# Patient Record
Sex: Male | Born: 1970
Health system: Southern US, Community
[De-identification: ages and names within clinical notes are randomized; demographics above are authoritative.]

## PROBLEM LIST (undated history)

## (undated) DIAGNOSIS — K219 Gastro-esophageal reflux disease without esophagitis: Secondary | ICD-10-CM

## (undated) HISTORY — PX: HERNIA REPAIR: SHX51

---

## 2005-04-17 ENCOUNTER — Emergency Department: Payer: Self-pay | Admitting: Emergency Medicine

## 2006-09-25 ENCOUNTER — Emergency Department: Payer: Self-pay | Admitting: Emergency Medicine

## 2006-10-04 ENCOUNTER — Ambulatory Visit: Payer: Self-pay | Admitting: Otolaryngology

## 2010-03-07 ENCOUNTER — Emergency Department: Payer: Self-pay | Admitting: Emergency Medicine

## 2012-02-19 ENCOUNTER — Emergency Department: Payer: Self-pay | Admitting: Emergency Medicine

## 2012-09-16 ENCOUNTER — Emergency Department: Payer: Self-pay | Admitting: Emergency Medicine

## 2015-12-12 DIAGNOSIS — H5203 Hypermetropia, bilateral: Secondary | ICD-10-CM | POA: Diagnosis not present

## 2016-08-27 ENCOUNTER — Emergency Department
Admission: EM | Admit: 2016-08-27 | Discharge: 2016-08-27 | Disposition: A | Discharge status: Home / Self Care | Payer: Medicare Other | Attending: Emergency Medicine | Admitting: Emergency Medicine

## 2016-08-27 ENCOUNTER — Encounter: Payer: Self-pay | Admitting: Emergency Medicine

## 2016-08-27 DIAGNOSIS — K219 Gastro-esophageal reflux disease without esophagitis: Secondary | ICD-10-CM

## 2016-08-27 DIAGNOSIS — R066 Hiccough: Secondary | ICD-10-CM | POA: Diagnosis present

## 2016-08-27 HISTORY — DX: Gastro-esophageal reflux disease without esophagitis: K21.9

## 2016-08-27 MED ORDER — RANITIDINE HCL 300 MG PO CAPS: 300.0000 mg | ORAL_CAPSULE | Freq: Every evening | ORAL | 0 refills | Status: AC

## 2016-08-27 NOTE — ED Triage Notes (Signed)
Pt in via POV, pt states "I have indigestion and I'm just tired of dealing with it."  Pt reports "burping and hiccups are neverending."  Pt states, "I take medication for it everyday but it doesn't work."  Pt denies any chest pain, denies any N/V/D.  NAD noted at this time.

## 2016-08-27 NOTE — ED Provider Notes (Signed)
[facility] Emergency Department Provider Note  ____________________________________________  Time seen: Approximately 3:52 PM  I have reviewed the triage vital signs and the nursing notes.   HISTORY  Chief Complaint Gastroesophageal Reflux    HPI Albert George is a 46 y.o. male who presents emergency department complaining of GERD symptoms as well as hiccuping. Patient reports that he has had gastroesophageal reflux disease since his early 73s. Patient states that he has been seen by his primary care in the past and diagnosed with this condition. Patient is reporting that he supposed to take omeprazole for this condition. He reports that when he remembers to take the medication or he has the medication, symptoms are controlled. Recently, patient has not been taking his medication and he has had an increase in belching and hiccups. Patient denies any emesis. He denies any diarrhea or constipation. He denies any hematemesis or hematochezia. He denies any abdominal pain. Patient presents emergency department because "I'm tired of dealing with this. I had this for over 20 years and I wanted to stop." Patient has been seen by gastroenterology but states that he doesn't know when the last time he was seen by GI or who the provider was. Patient states that he is currently not taking his omeprazole.   Past Medical History:  Diagnosis Date  . GERD (gastroesophageal reflux disease)     There are no active problems to display for this patient.   Past Surgical History:  Procedure Laterality Date  . HERNIA REPAIR      Prior to Admission medications   Medication Sig Start Date End Date Taking? Authorizing Provider  ranitidine (ZANTAC) 300 MG capsule Take 1 capsule (300 mg total) by mouth every evening. 08/27/16   Delorise Royals Cuthriell, PA-C    Allergies Patient has no known allergies.  No family history on file.  Social History Social History  Substance Use  Topics  . Smoking status: Never Smoker  . Smokeless tobacco: Never Used  . Alcohol use No     Comment: occasional      Review of Systems  Constitutional: No fever/chills ENT: No upper respiratory complaints. Cardiovascular: no chest pain. Respiratory: no cough. No SOB. Gastrointestinal: No abdominal pain. Positive for belching and hiccups. No nausea, no vomiting.  No diarrhea.  No constipation. Musculoskeletal: Negative for musculoskeletal pain. Skin: Negative for rash, abrasions, lacerations, ecchymosis. Neurological: Negative for headaches, focal weakness or numbness. 10-point ROS otherwise negative.  ____________________________________________   PHYSICAL EXAM:  VITAL SIGNS: ED Triage Vitals  Enc Vitals Group     BP 08/27/16 1447 (!) 136/98     Pulse Rate 08/27/16 1447 67     Resp 08/27/16 1447 16     Temp 08/27/16 1447 98 F (36.7 C)     Temp Source 08/27/16 1447 Oral     SpO2 08/27/16 1447 99 %     Weight 08/27/16 1448 180 lb (81.6 kg)     Height 08/27/16 1448 5\' 9"  (1.753 m)     Head Circumference --      Peak Flow --      Pain Score --      Pain Loc --      Pain Edu? --      Excl. in GC? --      Constitutional: Alert and oriented. Well appearing and in no acute distress. Eyes: Conjunctivae are normal. PERRL. EOMI. Head: Atraumatic. ENT:      Ears:       Nose:  No congestion/rhinnorhea.      Mouth/Throat: Mucous membranes are moist. Oropharynx is nonerythematous and nonedematous. Uvula is midline. No dental erosions consistent with repetitive emesis. Neck: No stridor.   Hematological/Lymphatic/Immunilogical: No cervical lymphadenopathy. Cardiovascular: Normal rate, regular rhythm. Normal S1 and S2.  Good peripheral circulation. Respiratory: Normal respiratory effort without tachypnea or retractions. Lungs CTAB. Good air entry to the bases with no decreased or absent breath sounds. Gastrointestinal: Bowel sounds 4 quadrants. Soft and nontender to  palpation. No guarding or rigidity. No palpable masses. No distention. No CVA tenderness Musculoskeletal: Full range of motion to all extremities. No gross deformities appreciated. Neurologic:  Normal speech and language. No gross focal neurologic deficits are appreciated.  Skin:  Skin is warm, dry and intact. No rash noted. Psychiatric: Mood and affect are normal. Speech and behavior are normal. Patient exhibits appropriate insight and judgement.   ____________________________________________   LABS (all labs ordered are listed, but only abnormal results are displayed)  Labs Reviewed - No data to display ____________________________________________  EKG   ____________________________________________  RADIOLOGY   No results found.  ____________________________________________    PROCEDURES  Procedure(s) performed:    Procedures    Medications - No data to display   ____________________________________________   INITIAL IMPRESSION / ASSESSMENT AND PLAN / ED COURSE  Pertinent labs & imaging results that were available during my care of the patient were reviewed by me and considered in my medical decision making (see chart for details).  Review of the [place] CSRS was performed in accordance of the [facility] prior to dispensing any controlled drugs.     Patient's diagnosis is consistent with GERD. Patient has had symptoms for 25 years and reports being "tired of having the symptoms". She has been prescribed omeprazole in the past but is currently not taking medications. Patient reports that while taking medication, symptoms are minimized. I advised patient to continue this medication as well as prescribing ranitidine. Patient is given information to follow up with gastroenterology. no indication for labs or imaging at this time. Patient is given ED precautions to return to the ED for any worsening or new symptoms.     ____________________________________________  FINAL  CLINICAL IMPRESSION(S) / ED DIAGNOSES  Final diagnoses:  Gastroesophageal reflux disease, esophagitis presence not specified      NEW MEDICATIONS STARTED DURING THIS VISIT:  New Prescriptions   RANITIDINE (ZANTAC) 300 MG CAPSULE    Take 1 capsule (300 mg total) by mouth every evening.        This chart was dictated using voice recognition software/Dragon. Despite best efforts to proofread, errors can occur which can change the meaning. Any change was purely unintentional.    Racheal PatchesJonathan D Cuthriell, PA-C 08/27/16 1608    Jennye MoccasinBrian S Quigley, MD 08/27/16 [phone]

## 2016-10-17 DIAGNOSIS — M5416 Radiculopathy, lumbar region: Secondary | ICD-10-CM | POA: Diagnosis not present

## 2016-10-17 DIAGNOSIS — M531 Cervicobrachial syndrome: Secondary | ICD-10-CM | POA: Diagnosis not present

## 2016-10-17 DIAGNOSIS — M9903 Segmental and somatic dysfunction of lumbar region: Secondary | ICD-10-CM | POA: Diagnosis not present

## 2016-10-17 DIAGNOSIS — M9901 Segmental and somatic dysfunction of cervical region: Secondary | ICD-10-CM | POA: Diagnosis not present

## 2017-11-22 DIAGNOSIS — I10 Essential (primary) hypertension: Secondary | ICD-10-CM | POA: Diagnosis not present

## 2017-11-22 DIAGNOSIS — Z125 Encounter for screening for malignant neoplasm of prostate: Secondary | ICD-10-CM | POA: Diagnosis not present

## 2017-11-22 DIAGNOSIS — R066 Hiccough: Secondary | ICD-10-CM | POA: Diagnosis not present

## 2017-11-22 DIAGNOSIS — Z7689 Persons encountering health services in other specified circumstances: Secondary | ICD-10-CM | POA: Diagnosis not present

## 2017-11-22 DIAGNOSIS — K219 Gastro-esophageal reflux disease without esophagitis: Secondary | ICD-10-CM | POA: Diagnosis not present

## 2017-11-24 ENCOUNTER — Other Ambulatory Visit: Payer: Self-pay | Admitting: Internal Medicine

## 2017-11-24 DIAGNOSIS — R7989 Other specified abnormal findings of blood chemistry: Secondary | ICD-10-CM

## 2017-11-24 DIAGNOSIS — R748 Abnormal levels of other serum enzymes: Secondary | ICD-10-CM

## 2017-11-24 DIAGNOSIS — R945 Abnormal results of liver function studies: Secondary | ICD-10-CM

## 2017-12-11 ENCOUNTER — Other Ambulatory Visit: Payer: Self-pay | Admitting: Gastroenterology

## 2017-12-11 DIAGNOSIS — R7989 Other specified abnormal findings of blood chemistry: Secondary | ICD-10-CM

## 2017-12-11 DIAGNOSIS — R945 Abnormal results of liver function studies: Secondary | ICD-10-CM | POA: Diagnosis not present

## 2017-12-11 DIAGNOSIS — R066 Hiccough: Secondary | ICD-10-CM | POA: Diagnosis not present

## 2017-12-11 DIAGNOSIS — K219 Gastro-esophageal reflux disease without esophagitis: Secondary | ICD-10-CM | POA: Diagnosis not present

## 2017-12-14 ENCOUNTER — Ambulatory Visit
Admission: RE | Admit: 2017-12-14 | Discharge: 2017-12-14 | Disposition: A | Discharge status: Home / Self Care | Payer: Medicare Other | Source: Ambulatory Visit | Attending: Gastroenterology | Admitting: Gastroenterology

## 2017-12-14 DIAGNOSIS — K8689 Other specified diseases of pancreas: Secondary | ICD-10-CM | POA: Diagnosis not present

## 2017-12-14 DIAGNOSIS — R7989 Other specified abnormal findings of blood chemistry: Secondary | ICD-10-CM

## 2017-12-14 DIAGNOSIS — K219 Gastro-esophageal reflux disease without esophagitis: Secondary | ICD-10-CM | POA: Insufficient documentation

## 2017-12-14 DIAGNOSIS — R945 Abnormal results of liver function studies: Secondary | ICD-10-CM | POA: Insufficient documentation

## 2018-01-08 DIAGNOSIS — K219 Gastro-esophageal reflux disease without esophagitis: Secondary | ICD-10-CM | POA: Diagnosis not present

## 2018-02-07 ENCOUNTER — Other Ambulatory Visit: Payer: Self-pay | Admitting: Internal Medicine

## 2018-02-07 DIAGNOSIS — R7989 Other specified abnormal findings of blood chemistry: Secondary | ICD-10-CM

## 2018-02-07 DIAGNOSIS — R748 Abnormal levels of other serum enzymes: Secondary | ICD-10-CM

## 2018-02-07 DIAGNOSIS — R945 Abnormal results of liver function studies: Secondary | ICD-10-CM

## 2018-02-13 ENCOUNTER — Ambulatory Visit
Admission: RE | Admit: 2018-02-13 | Discharge: 2018-02-13 | Disposition: A | Discharge status: Home / Self Care | Payer: Medicare Other | Source: Ambulatory Visit | Attending: Internal Medicine | Admitting: Internal Medicine

## 2018-02-13 DIAGNOSIS — R748 Abnormal levels of other serum enzymes: Secondary | ICD-10-CM | POA: Insufficient documentation

## 2018-02-13 DIAGNOSIS — R7989 Other specified abnormal findings of blood chemistry: Secondary | ICD-10-CM | POA: Diagnosis not present

## 2018-02-13 DIAGNOSIS — R945 Abnormal results of liver function studies: Secondary | ICD-10-CM | POA: Diagnosis not present

## 2018-04-11 DIAGNOSIS — K760 Fatty (change of) liver, not elsewhere classified: Secondary | ICD-10-CM | POA: Insufficient documentation

## 2018-04-11 DIAGNOSIS — M545 Low back pain: Secondary | ICD-10-CM | POA: Diagnosis not present

## 2018-04-11 DIAGNOSIS — I10 Essential (primary) hypertension: Secondary | ICD-10-CM | POA: Diagnosis not present

## 2018-04-11 DIAGNOSIS — G8929 Other chronic pain: Secondary | ICD-10-CM | POA: Diagnosis not present

## 2018-06-07 DIAGNOSIS — I10 Essential (primary) hypertension: Secondary | ICD-10-CM | POA: Diagnosis not present

## 2018-06-07 DIAGNOSIS — M545 Low back pain: Secondary | ICD-10-CM | POA: Diagnosis not present

## 2018-06-07 DIAGNOSIS — K760 Fatty (change of) liver, not elsewhere classified: Secondary | ICD-10-CM | POA: Diagnosis not present

## 2018-06-07 DIAGNOSIS — G8929 Other chronic pain: Secondary | ICD-10-CM | POA: Diagnosis not present

## 2018-06-07 DIAGNOSIS — Z23 Encounter for immunization: Secondary | ICD-10-CM | POA: Diagnosis not present

## 2018-08-22 IMAGING — US US ABDOMEN COMPLETE
1 series · 13 of 25 positions shown · non-contrast
Comparison: CT abdomen and pelvis of 04/17/2005

CLINICAL DATA: Elevated liver function tests, history of
gastroesophageal reflux

EXAM:
ABDOMEN ULTRASOUND COMPLETE

[Series 1: us abdomen complete · 0.22mm/px · 13 of 108 slices shown]
[im 1/108]
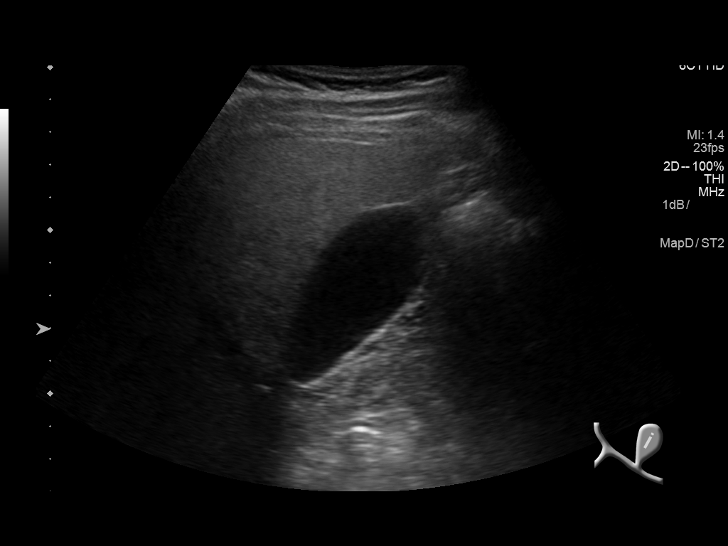
[im 9/108]
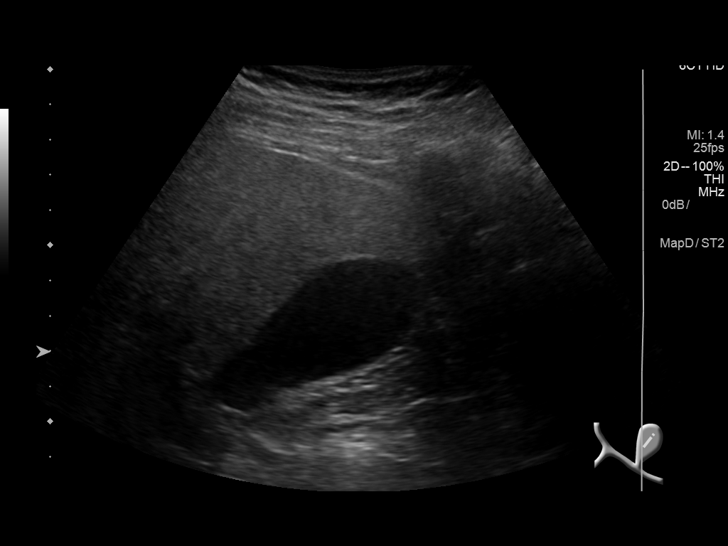
[im 18/108]
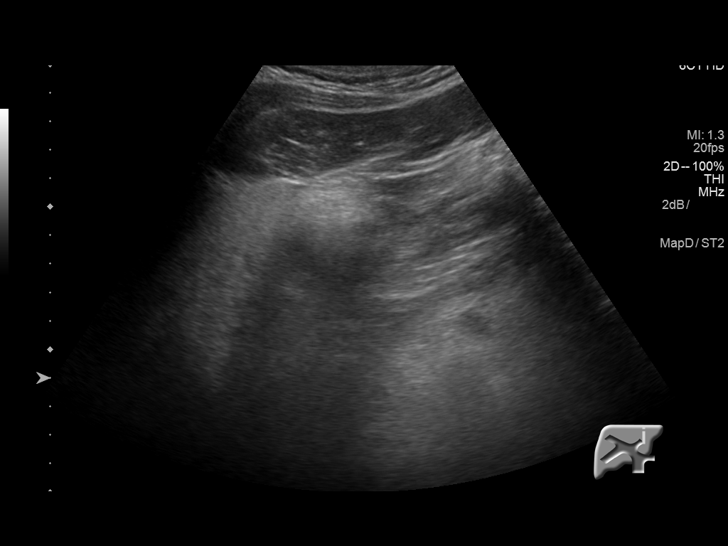
[im 27/108]
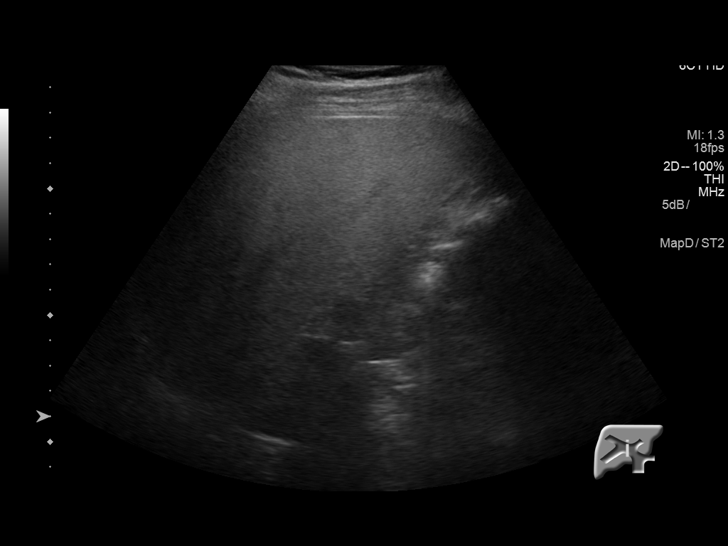
[im 36/108]
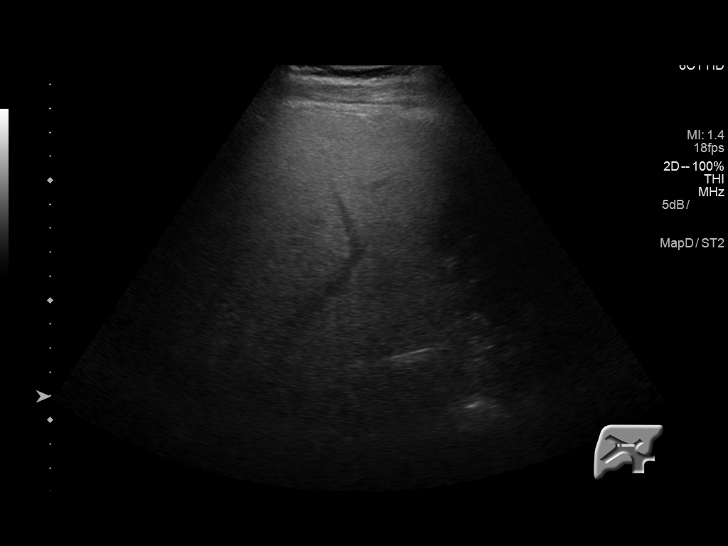
[im 45/108]
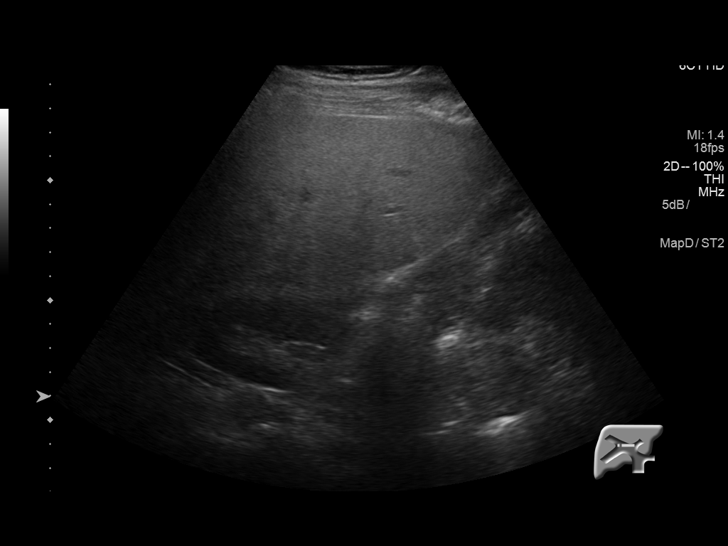
[im 54/108]
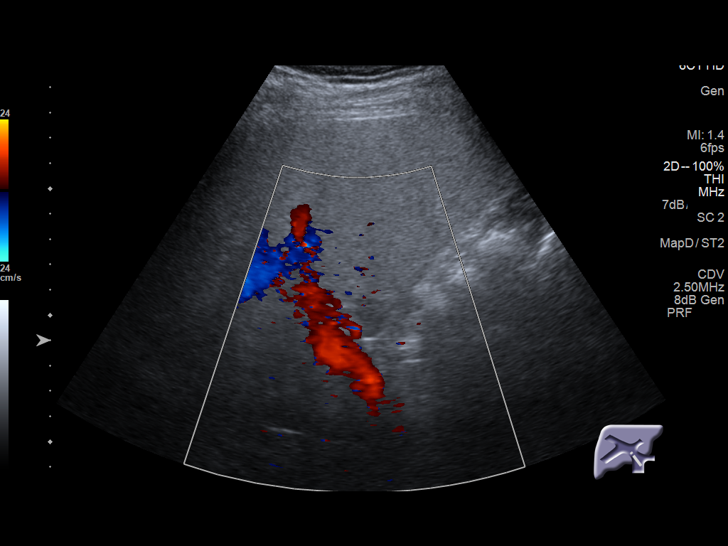
[im 63/108]
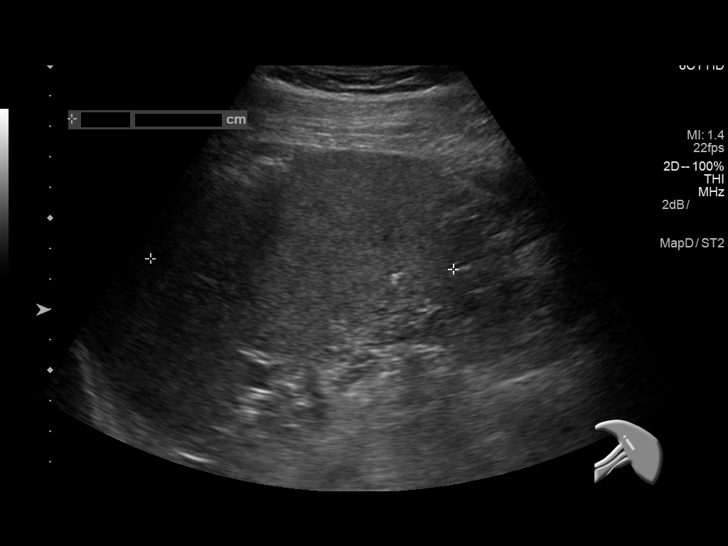
[im 72/108]
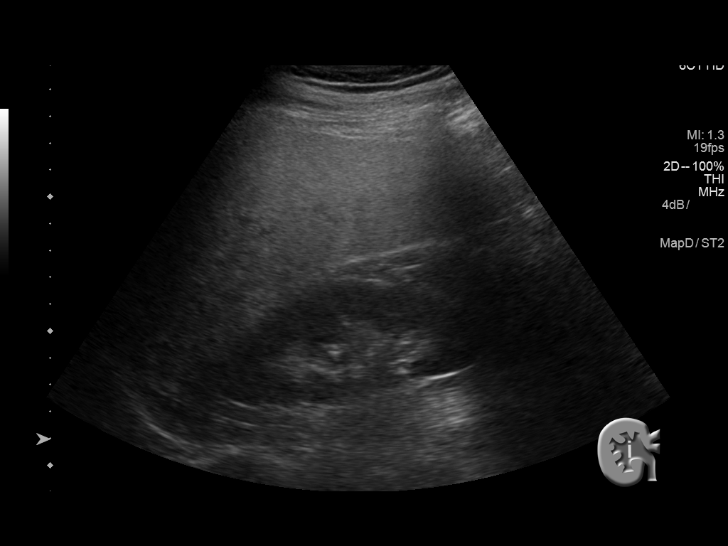
[im 81/108]
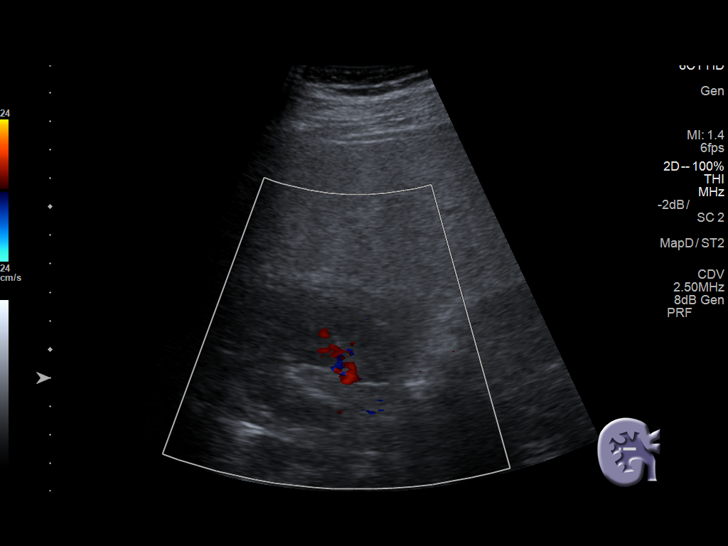
[im 90/108]
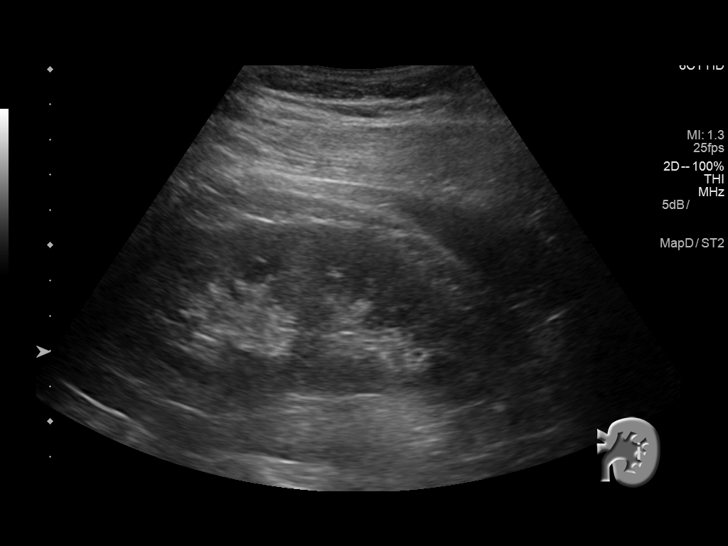
[im 99/108]
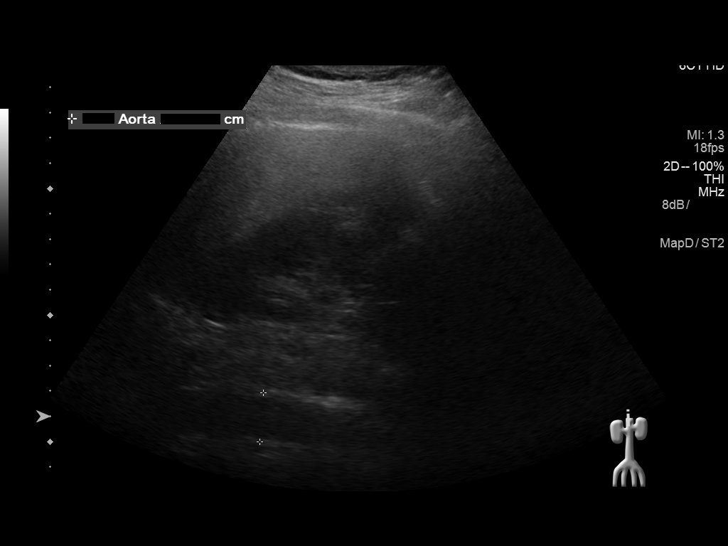
[im 108/108]
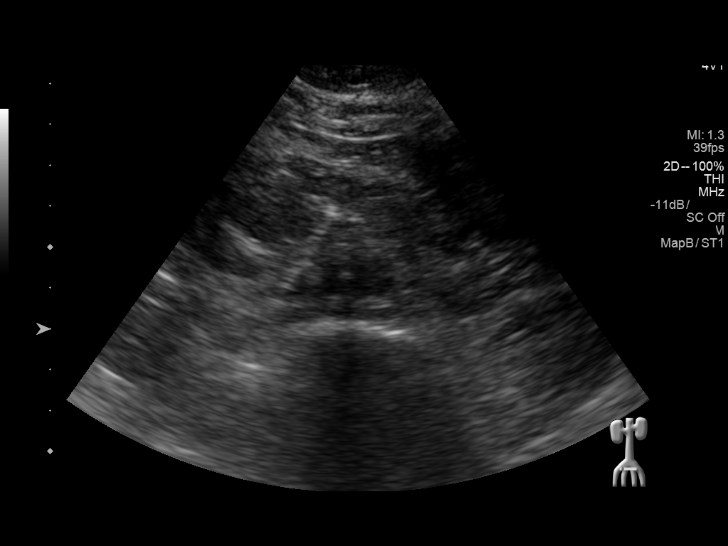

[13 of 25 positions shown; findings below may reference images not displayed]

FINDINGS: Gallbladder: The gallbladder is visualized and no gallstones are
noted. There is no pain over the gallbladder with compression.

Common bile duct: Diameter: The common bile duct is normal measuring
2.3 mm in diameter.

Liver: The parenchyma of the liver is diffusely echogenic consistent
with hepatic steatosis. No focal hepatic abnormality is seen. Portal
vein is patent on color Doppler imaging with normal direction of
blood flow towards the liver.

IVC: No abnormality visualized.

Pancreas: Pancreas is largely obscured by bowel gas and cannot be
evaluated.

Spleen: The spleen measures 10.0 cm with a small accessory spleen as
well of 2.5 cm.

Right Kidney: Length: 10.2 cm..  No hydronephrosis is seen.

Left Kidney: Length: 10.3 cm..  No hydronephrosis is noted.

Abdominal aorta: The abdominal aorta is normal caliber.

Other findings: None.
IMPRESSION: 1. Echogenic liver parenchyma consistent with hepatic steatosis. No
focal hepatic abnormality is seen.
2. No gallstones.
3. Much of the pancreas is obscured by bowel gas.

## 2018-10-22 IMAGING — US US ABDOMEN LIMITED
1 series · 14 of 25 positions shown · non-contrast
Comparison: Ultrasound 12/14/2017.

CLINICAL DATA: Elevated liver function test.

EXAM:
ULTRASOUND ABDOMEN LIMITED RIGHT UPPER QUADRANT

[Series 1: us abdomen limited · 0.20mm/px · 14 of 45 slices shown]
[im 1/45]
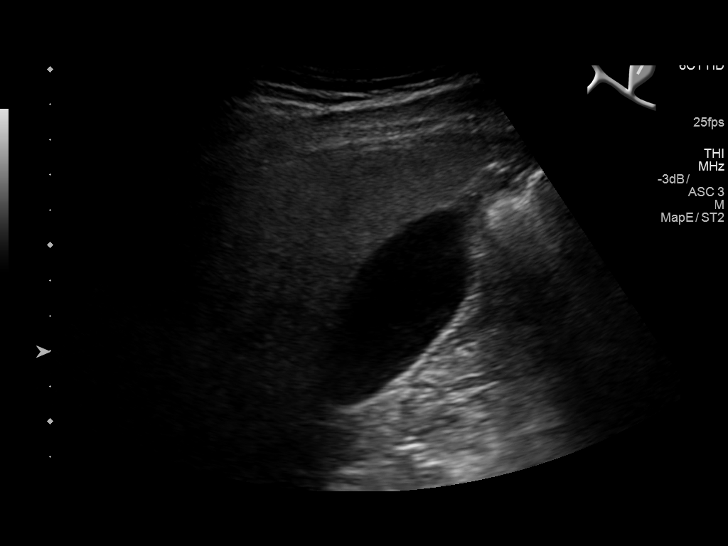
[im 4/45]
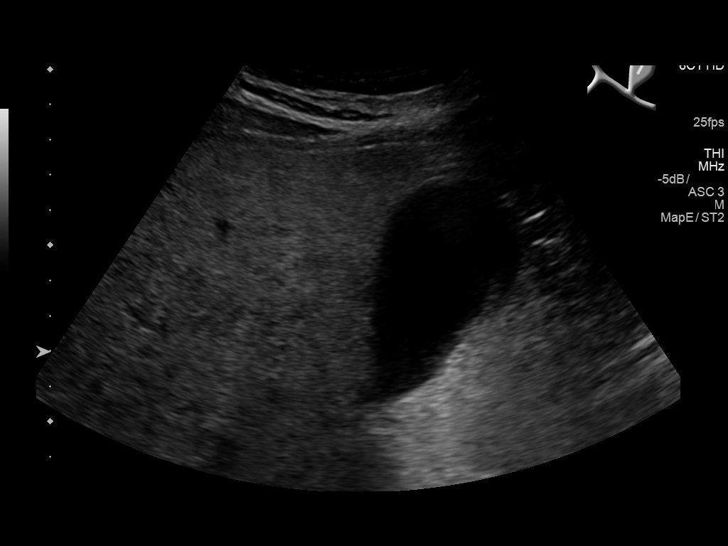
[im 8/45]
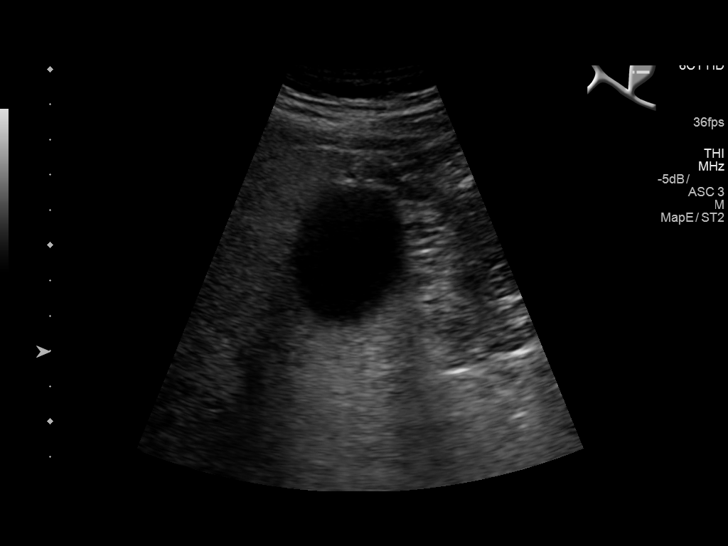
[im 12/45]
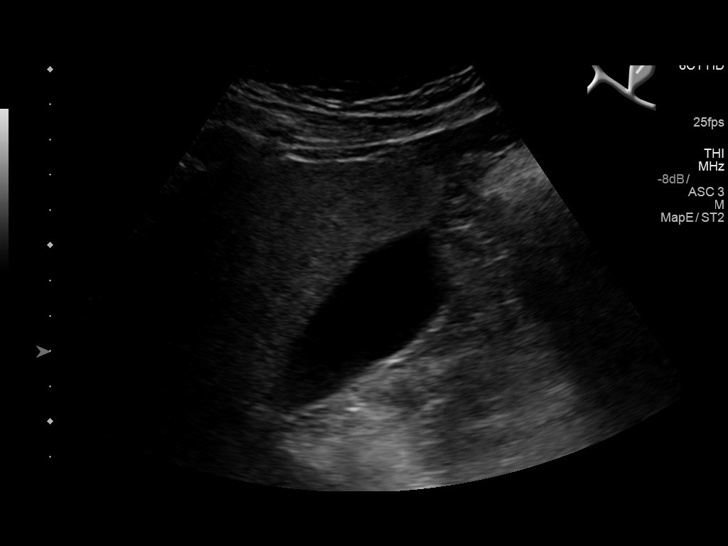
[im 15/45]
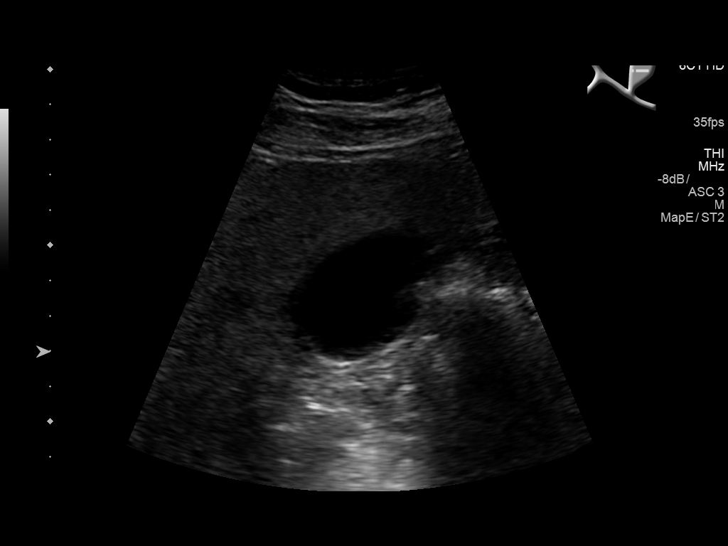
[im 17/45]
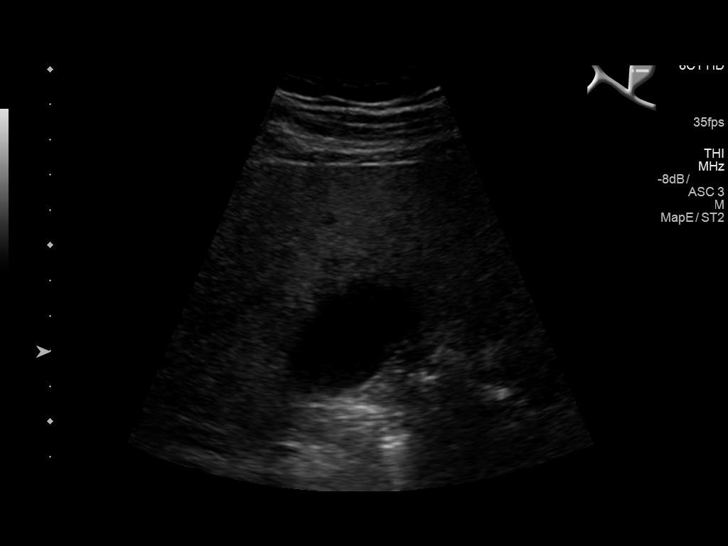
[im 21/45]
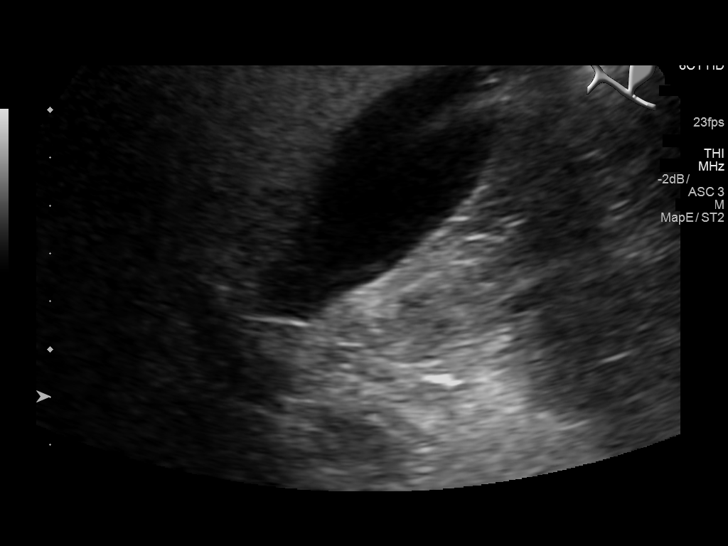
[im 24/45]
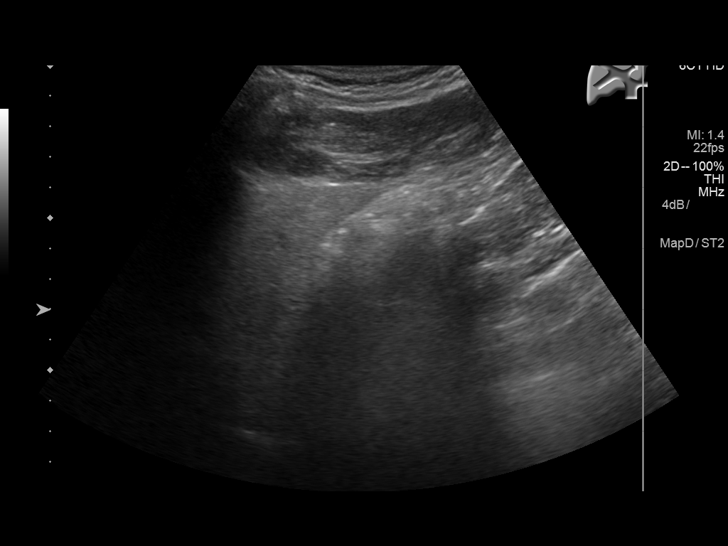
[im 28/45]
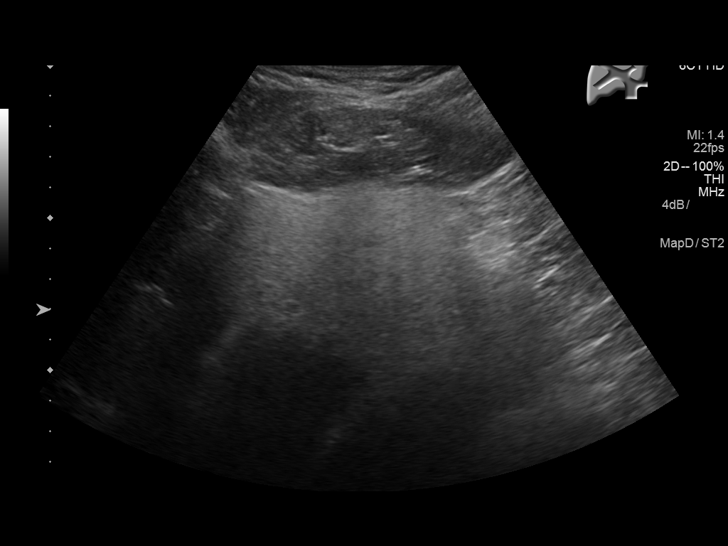
[im 30/45]
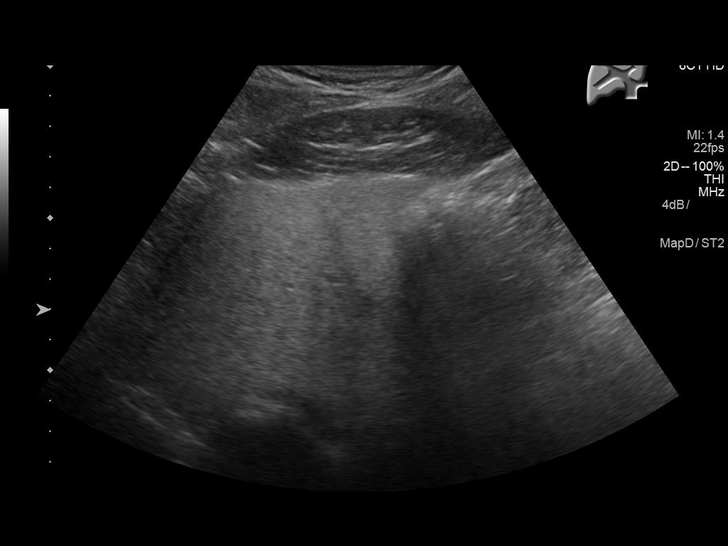
[im 34/45]
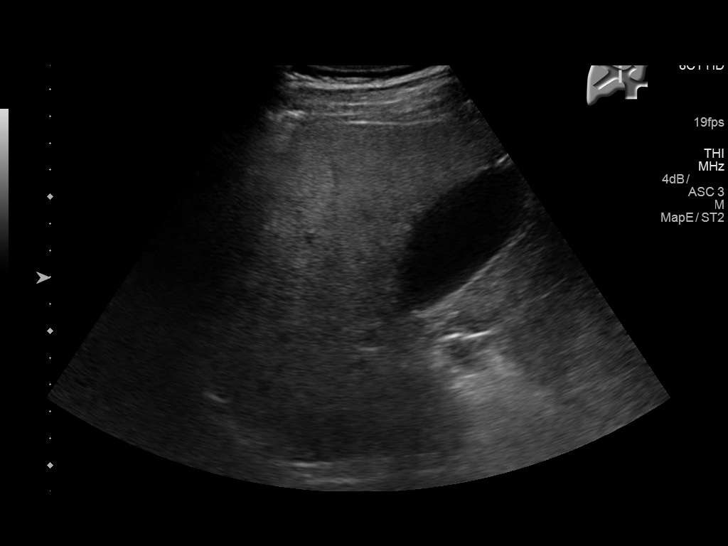
[im 37/45]
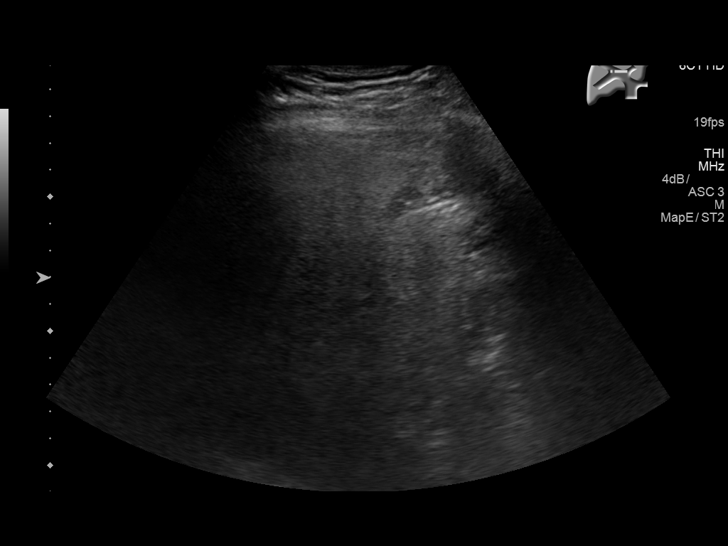
[im 41/45]
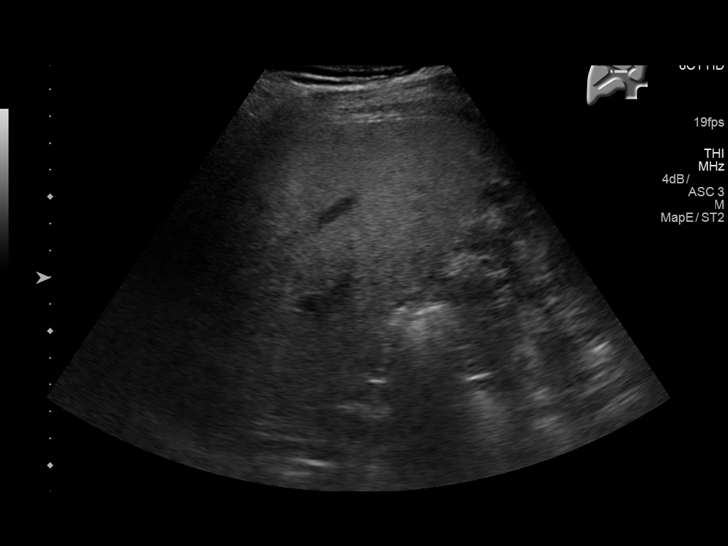
[im 45/45]
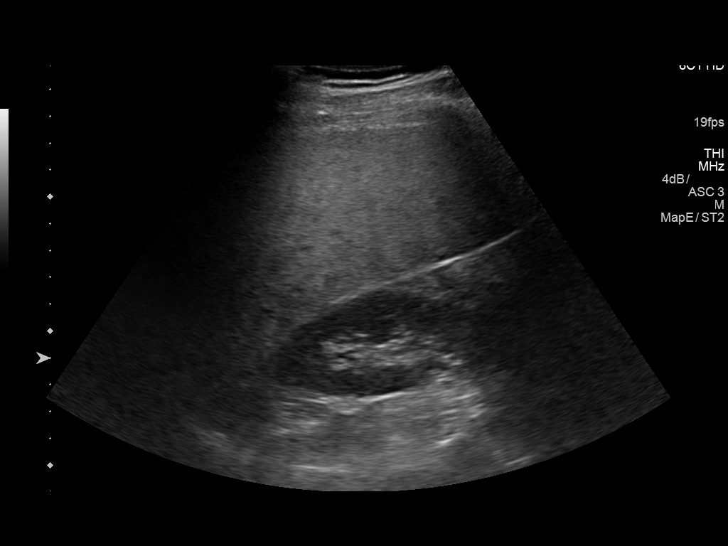

[14 of 25 positions shown; findings below may reference images not displayed]

FINDINGS: Gallbladder:

No gallstones or wall thickening visualized. No sonographic Murphy
sign noted by sonographer.

Common bile duct:

Diameter: 2.4 mm

Liver:

Increased echogenicity liver again noted consistent fatty
infiltration and/or hepatocellular disease. No focal hepatic
abnormality identified. Portal vein is patent on color Doppler
imaging with normal direction of blood flow towards the liver.
IMPRESSION: 1. Increased hepatic echogenicity consistent fatty infiltration or
hepatocellular disease. Similar findings noted on prior exam. No
focal hepatic abnormality identified.

2. No gallstones or biliary distention. No acute abnormality
identified.

## 2019-05-20 ENCOUNTER — Other Ambulatory Visit: Payer: Self-pay

## 2019-05-20 NOTE — Patient Outreach (Signed)
[facility]) Care Management  05/20/2019  [name] [birth year] [id number]   Medication Adherence call to Mr. Albert George Hippa Identifiers Verify spoke with patient he is past due on Losartan 50 mg patient explain he takes 1 tablet daily and has enough for a couple of days patient ask if we can call [facility] an order this medication [facility] office for a refill request. Mr. Panas is showing past due under [place].   [place] Management Direct Dial [phone]  Fax [phone] [email or web address]

## 2019-07-10 DIAGNOSIS — Z0000 Encounter for general adult medical examination without abnormal findings: Secondary | ICD-10-CM | POA: Diagnosis not present

## 2019-07-10 DIAGNOSIS — I10 Essential (primary) hypertension: Secondary | ICD-10-CM | POA: Diagnosis not present

## 2019-07-10 DIAGNOSIS — K219 Gastro-esophageal reflux disease without esophagitis: Secondary | ICD-10-CM | POA: Insufficient documentation

## 2019-07-10 DIAGNOSIS — K760 Fatty (change of) liver, not elsewhere classified: Secondary | ICD-10-CM | POA: Diagnosis not present

## 2019-07-10 DIAGNOSIS — G8929 Other chronic pain: Secondary | ICD-10-CM | POA: Insufficient documentation

## 2019-09-03 ENCOUNTER — Ambulatory Visit: Payer: Medicare Other | Attending: Internal Medicine

## 2019-09-03 DIAGNOSIS — Z20822 Contact with and (suspected) exposure to covid-19: Secondary | ICD-10-CM | POA: Diagnosis not present

## 2019-09-04 LAB — NOVEL CORONAVIRUS, NAA: SARS-CoV-2, NAA: NOT DETECTED

## 2020-02-16 ENCOUNTER — Encounter: Payer: Self-pay | Admitting: Emergency Medicine

## 2020-02-16 ENCOUNTER — Other Ambulatory Visit: Payer: Self-pay

## 2020-02-16 ENCOUNTER — Emergency Department: Payer: Medicare HMO

## 2020-02-16 ENCOUNTER — Emergency Department
Admission: EM | Admit: 2020-02-16 | Discharge: 2020-02-16 | Disposition: A | Discharge status: Home / Self Care | Payer: Medicare HMO | Attending: Emergency Medicine | Admitting: Emergency Medicine

## 2020-02-16 DIAGNOSIS — Z79899 Other long term (current) drug therapy: Secondary | ICD-10-CM | POA: Diagnosis not present

## 2020-02-16 DIAGNOSIS — R079 Chest pain, unspecified: Secondary | ICD-10-CM | POA: Diagnosis present

## 2020-02-16 DIAGNOSIS — K219 Gastro-esophageal reflux disease without esophagitis: Secondary | ICD-10-CM | POA: Insufficient documentation

## 2020-02-16 DIAGNOSIS — R5383 Other fatigue: Secondary | ICD-10-CM | POA: Insufficient documentation

## 2020-02-16 DIAGNOSIS — R0789 Other chest pain: Secondary | ICD-10-CM | POA: Insufficient documentation

## 2020-02-16 DIAGNOSIS — K2100 Gastro-esophageal reflux disease with esophagitis, without bleeding: Secondary | ICD-10-CM

## 2020-02-16 LAB — TROPONIN I (HIGH SENSITIVITY)
Troponin I (High Sensitivity): 3 ng/L (ref <18)
Troponin I (High Sensitivity): 3 ng/L (ref <18)

## 2020-02-16 LAB — CBC
HCT: 41.8 % (ref 39.0–52.0)
Hemoglobin: 14.4 g/dL (ref 13.0–17.0)
MCH: 31.4 pg (ref 26.0–34.0)
MCHC: 34.4 g/dL (ref 30.0–36.0)
MCV: 91.1 fL (ref 80.0–100.0)
Platelets: 180 10*3/uL (ref 150–400)
RBC: 4.59 MIL/uL (ref 4.22–5.81)
RDW: 11.6 % (ref 11.5–15.5)
WBC: 5.8 10*3/uL (ref 4.0–10.5)
nRBC: 0 % (ref 0.0–0.2)

## 2020-02-16 LAB — BASIC METABOLIC PANEL
Anion gap: 7 (ref 5–15)
BUN: 16 mg/dL (ref 6–20)
CO2: 27 mmol/L (ref 22–32)
Calcium: 9.2 mg/dL (ref 8.9–10.3)
Chloride: 103 mmol/L (ref 98–111)
Creatinine, Ser: 1.15 mg/dL (ref 0.61–1.24)
GFR calc Af Amer: >60 mL/min (ref >60)
GFR calc non Af Amer: >60 mL/min (ref >60)
Glucose, Bld: 103 mg/dL — ABNORMAL HIGH (ref 70–99)
Potassium: 3.9 mmol/L (ref 3.5–5.1)
Sodium: 137 mmol/L (ref 135–145)

## 2020-02-16 MED ORDER — SUCRALFATE 1 G PO TABS: 1.0000 g | ORAL_TABLET | Freq: Three times a day (TID) | ORAL | 0 refills | Status: AC

## 2020-02-16 MED ORDER — HYOSCYAMINE SULFATE 0.125 MG SL SUBL
0.2500 mg | SUBLINGUAL_TABLET | Freq: Once | SUBLINGUAL | Status: AC
  Administered 2020-02-16: 21:00:00 0.25 mg via SUBLINGUAL
  Filled 2020-02-16: qty 2

## 2020-02-16 MED ORDER — OMEPRAZOLE 20 MG PO CPDR: 20.0000 mg | DELAYED_RELEASE_CAPSULE | Freq: Two times a day (BID) | ORAL | 0 refills | Status: AC

## 2020-02-16 MED ORDER — ALUM & MAG HYDROXIDE-SIMETH 200-200-20 MG/5ML PO SUSP
30.0000 mL | Freq: Once | ORAL | Status: AC
  Administered 2020-02-16: 21:00:00 30 mL via ORAL
  Filled 2020-02-16: qty 30

## 2020-02-16 MED ORDER — LIDOCAINE VISCOUS HCL 2 % MT SOLN
15.0000 mL | Freq: Once | OROMUCOSAL | Status: AC
  Administered 2020-02-16: 21:00:00 15 mL via ORAL
  Filled 2020-02-16: qty 15

## 2020-02-16 NOTE — ED Triage Notes (Signed)
Pt here for chest pain to center of chest that he describes as heavy.  Started early this morning while letting dog out.  Has bene intermittent since. VSS.  No fever.  Ambulatory without distress.  Unlabored.

## 2020-02-16 NOTE — ED Notes (Signed)
Pt sitting on bench talking on cell phone. Pt in NAD.

## 2020-02-16 NOTE — Discharge Instructions (Addendum)
Start taking the Omeprazole twice a day  Call Dr. Allegra Lai for follow-up to discuss repeat scope/treatment  Take the Carafate for the next week to see if it helps

## 2020-02-16 NOTE — ED Provider Notes (Signed)
[facility] Emergency Department Provider Note  ____________________________________________   First MD Initiated Contact with Patient 02/16/20 2050     (approximate)  I have reviewed the triage vital signs and the nursing notes.   HISTORY  Chief Complaint Chest Pain    HPI Albert George is a 49 y.o. male with h/o GERD here with chest pain. Pt reports that off and on "for months," he's had intermittent severe substernal pain that is worse at night, worse with lying flat, and random. He states that he has a h/o GERD and feels like his pain comes on after eating, and is associated with burping and indigestion. He is able to get some relief after burping. He denies known h/o CAD or cardiac disease. No SOB, diaphoresis. No palpitations. Pain is now resolved. No vomiting. No diarrhea.       Past Medical History:  Diagnosis Date  . GERD (gastroesophageal reflux disease)     There are no problems to display for this patient.   Past Surgical History:  Procedure Laterality Date  . HERNIA REPAIR      Prior to Admission medications   Medication Sig Start Date End Date Taking? Authorizing Provider  ranitidine (ZANTAC) 300 MG capsule Take 1 capsule (300 mg total) by mouth every evening. 08/27/16   Cuthriell, Delorise Royals, PA-C    Allergies Patient has no known allergies.  History reviewed. No pertinent family history.  Social History Social History   Tobacco Use  . Smoking status: Never Smoker  . Smokeless tobacco: Never Used  Substance Use Topics  . Alcohol use: No    Comment: occasional   . Drug use: No    Review of Systems  Review of Systems  Constitutional: Positive for fatigue. Negative for chills and fever.  HENT: Negative for sore throat.   Respiratory: Positive for chest tightness. Negative for shortness of breath.   Cardiovascular: Positive for chest pain.  Gastrointestinal: Positive for nausea. Negative for abdominal pain.    Genitourinary: Negative for flank pain.  Musculoskeletal: Negative for neck pain.  Skin: Negative for rash and wound.  Allergic/Immunologic: Negative for immunocompromised state.  Neurological: Negative for weakness and numbness.  Hematological: Does not bruise/bleed easily.  All other systems reviewed and are negative.    ____________________________________________  PHYSICAL EXAM:      VITAL SIGNS: ED Triage Vitals [02/16/20 1330]  Enc Vitals Group     BP 133/84     Pulse Rate (!) 54     Resp 16     Temp 97.9 F (36.6 C)     Temp Source Oral     SpO2 98 %     Weight 195 lb (88.5 kg)     Height 5\' 9"  (1.753 m)     Head Circumference      Peak Flow      Pain Score 4     Pain Loc      Pain Edu?      Excl. in GC?      Physical Exam Vitals and nursing note reviewed.  Constitutional:      General: He is not in acute distress.    Appearance: He is well-developed.  HENT:     Head: Normocephalic and atraumatic.  Eyes:     Conjunctiva/sclera: Conjunctivae normal.  Cardiovascular:     Rate and Rhythm: Normal rate and regular rhythm.     Heart sounds: Normal heart sounds.  Pulmonary:     Effort: Pulmonary effort  is normal. No respiratory distress.     Breath sounds: No wheezing.  Abdominal:     General: There is no distension.  Musculoskeletal:     Cervical back: Neck supple.  Skin:    General: Skin is warm.     Capillary Refill: Capillary refill takes less than 2 seconds.     Findings: No rash.  Neurological:     Mental Status: He is alert and oriented to person, place, and time.     Motor: No abnormal muscle tone.       ____________________________________________   LABS (all labs ordered are listed, but only abnormal results are displayed)  Labs Reviewed  BASIC METABOLIC PANEL - Abnormal; Notable for the following components:      Result Value   Glucose, Bld 103 (*)    All other components within normal limits  CBC  TROPONIN I (HIGH SENSITIVITY)   TROPONIN I (HIGH SENSITIVITY)    ____________________________________________  EKG: Sinus bradycardia, ventricular rate 56.  PR 120, QRS 86, QTc 401.  No acute ST elevations or depressions. ________________________________________  RADIOLOGY All imaging, including plain films, CT scans, and ultrasounds, independently reviewed by me, and interpretations confirmed via formal radiology reads.  ED MD interpretation:   Chest x-ray: Clear  Official radiology report(s): DG Chest 2 View  Result Date: 02/16/2020 CLINICAL DATA:  Chest pain EXAM: CHEST - 2 VIEW COMPARISON:  None. FINDINGS: The heart size and mediastinal contours are within normal limits. Both lungs are clear. No pleural effusion or pneumothorax. The visualized skeletal structures are unremarkable. IMPRESSION: No acute process in the chest. Electronically Signed   By: Guadlupe Spanish M.D.   On: 02/16/2020 14:14    ____________________________________________  PROCEDURES   Procedure(s) performed (including Critical Care):  Procedures  ____________________________________________  INITIAL IMPRESSION / MDM / ASSESSMENT AND PLAN / ED COURSE  As part of my medical decision making, I reviewed the following data within the electronic MEDICAL RECORD NUMBER Nursing notes reviewed and incorporated, Old chart reviewed, Notes from prior ED visits, and [place] Controlled Substance Database       *CHUONG CASEBEER was evaluated in Emergency Department on 02/16/2020 for the symptoms described in the history of present illness. He was evaluated in the context of the global COVID-19 pandemic, which necessitated consideration that the patient might be at risk for infection with the SARS-CoV-2 virus that causes COVID-19. Institutional protocols and algorithms that pertain to the evaluation of patients at risk for COVID-19 are in a state of rapid change based on information released by regulatory bodies including the CDC and federal and state  organizations. These policies and algorithms were followed during the patient's care in the ED.  Some ED evaluations and interventions may be delayed as a result of limited staffing during the pandemic.*     Medical Decision Making: 49 year old male here with fairly atypical chest pain, now resolved.  EKG is nonischemic and troponins negative x2, doubt ACS.  Pain is not consistent with dissection or PE.  Of note, this seems to correlate with when his GERD is acting up, as well as be related to food and worse with lying flat.  Concern for possible esophagitis.  He has a known history of GERD.  Will have him increase his omeprazole, trial Carafate, and refer him back to GI.  Return precautions given.  ____________________________________________  FINAL CLINICAL IMPRESSION(S) / ED DIAGNOSES  Final diagnoses:  None     MEDICATIONS GIVEN DURING THIS VISIT:  Medications  alum & mag hydroxide-simeth (MAALOX/MYLANTA) 200-200-20 MG/5ML suspension 30 mL (30 mLs Oral Given 02/16/20 2123)    And  lidocaine (XYLOCAINE) 2 % viscous mouth solution 15 mL (15 mLs Oral Given 02/16/20 2123)  hyoscyamine (LEVSIN SL) SL tablet 0.25 mg (0.25 mg Sublingual Given 02/16/20 2122)     ED Discharge Orders    None       Note:  This document was prepared using Dragon voice recognition software and may include unintentional dictation errors.   Shaune Pollack, MD 02/16/20 2150

## 2020-02-16 NOTE — ED Notes (Signed)
Pt c/o mild CP- states CP has been intermittent. Pt denies SOB, dizziness, N/V/D. Pt is AOX4.

## 2020-09-14 ENCOUNTER — Emergency Department: Payer: Medicare HMO

## 2020-09-14 ENCOUNTER — Other Ambulatory Visit: Payer: Self-pay

## 2020-09-14 ENCOUNTER — Emergency Department
Admission: EM | Admit: 2020-09-14 | Discharge: 2020-09-14 | Disposition: A | Discharge status: Home / Self Care | Payer: Medicare HMO | Attending: Emergency Medicine | Admitting: Emergency Medicine

## 2020-09-14 DIAGNOSIS — N179 Acute kidney failure, unspecified: Secondary | ICD-10-CM | POA: Diagnosis not present

## 2020-09-14 DIAGNOSIS — R109 Unspecified abdominal pain: Secondary | ICD-10-CM | POA: Insufficient documentation

## 2020-09-14 DIAGNOSIS — U071 COVID-19: Secondary | ICD-10-CM | POA: Diagnosis not present

## 2020-09-14 DIAGNOSIS — R059 Cough, unspecified: Secondary | ICD-10-CM | POA: Diagnosis present

## 2020-09-14 DIAGNOSIS — B349 Viral infection, unspecified: Secondary | ICD-10-CM | POA: Diagnosis not present

## 2020-09-14 LAB — COMPREHENSIVE METABOLIC PANEL
ALT: 41 U/L (ref 0–44)
AST: 49 U/L — ABNORMAL HIGH (ref 15–41)
Albumin: 4.8 g/dL (ref 3.5–5.0)
Alkaline Phosphatase: 63 U/L (ref 38–126)
Anion gap: 8 (ref 5–15)
BUN: 20 mg/dL (ref 6–20)
CO2: 26 mmol/L (ref 22–32)
Calcium: 8.9 mg/dL (ref 8.9–10.3)
Chloride: 100 mmol/L (ref 98–111)
Creatinine, Ser: 1.34 mg/dL — ABNORMAL HIGH (ref 0.61–1.24)
GFR, Estimated: >60 mL/min (ref >60)
Glucose, Bld: 109 mg/dL — ABNORMAL HIGH (ref 70–99)
Potassium: 3.7 mmol/L (ref 3.5–5.1)
Sodium: 134 mmol/L — ABNORMAL LOW (ref 135–145)
Total Bilirubin: 1.1 mg/dL (ref 0.3–1.2)
Total Protein: 7.6 g/dL (ref 6.5–8.1)

## 2020-09-14 LAB — CBC
HCT: 40.7 % (ref 39.0–52.0)
Hemoglobin: 14.1 g/dL (ref 13.0–17.0)
MCH: 31.5 pg (ref 26.0–34.0)
MCHC: 34.6 g/dL (ref 30.0–36.0)
MCV: 90.8 fL (ref 80.0–100.0)
Platelets: 163 10*3/uL (ref 150–400)
RBC: 4.48 MIL/uL (ref 4.22–5.81)
RDW: 11.9 % (ref 11.5–15.5)
WBC: 4.2 10*3/uL (ref 4.0–10.5)
nRBC: 0 % (ref 0.0–0.2)

## 2020-09-14 LAB — RESP PANEL BY RT-PCR (FLU A&B, COVID) ARPGX2
Influenza A by PCR: NEGATIVE
Influenza B by PCR: NEGATIVE
SARS Coronavirus 2 by RT PCR: POSITIVE — AB

## 2020-09-14 MED ORDER — AZITHROMYCIN 250 MG PO TABS: ORAL_TABLET | ORAL | 0 refills | Status: AC

## 2020-09-14 MED ORDER — IOHEXOL 300 MG/ML  SOLN
100.0000 mL | Freq: Once | INTRAMUSCULAR | Status: AC | PRN
  Administered 2020-09-14: 100 mL via INTRAVENOUS

## 2020-09-14 MED ORDER — BENZONATATE 100 MG PO CAPS: 100.0000 mg | ORAL_CAPSULE | Freq: Three times a day (TID) | ORAL | 0 refills | Status: AC | PRN

## 2020-09-14 MED ORDER — SODIUM CHLORIDE 0.9 % IV BOLUS
1000.0000 mL | Freq: Once | INTRAVENOUS | Status: AC
  Administered 2020-09-14: 1000 mL via INTRAVENOUS

## 2020-09-14 NOTE — ED Triage Notes (Signed)
Pt states has had diarrhea, coughing and sneezing for "a couple of days now". Pt denies known fever. Pt appears in no acute distress. Pt also complains of lower abd pain.

## 2020-09-14 NOTE — Discharge Instructions (Addendum)
Given your continued cough we will give you some antibiotics to try to help clear it up and some Tessalon Perles to help with the coughing.  Your CT scan was negative for acute abdominal process.  Your Covid swab is still pending and should be followed up in MyChart  IMPRESSION:  1. No acute intra-abdominal or intrapelvic abnormality.  2. Hepatic steatosis.

## 2020-09-14 NOTE — ED Provider Notes (Signed)
[facility] Emergency Department Provider Note  ____________________________________________   Event Date/Time   First MD Initiated Contact with Patient 09/14/20 [phone]     (approximate)  I have reviewed the triage vital signs and the nursing notes.   HISTORY  Chief Complaint Diarrhea    HPI Albert George is a 50 y.o. male with GERD who comes in with multiple symptoms.  Patient reports having a cough for the past 3 days.  No shortness of breath or chest pain associated with it.  States that he cannot get it to stop.  He states that he is then developed yesterday diarrhea that was multiple times, no blood.  He then developed lower abdominal pain that was moderate, constant, nothing made it better, nothing made it worse.  History of hernia repair.  Patient reports being tested for Covid recently but he is not sure exactly when.  He has not had his vaccines.           Past Medical History:  Diagnosis Date  . GERD (gastroesophageal reflux disease)     There are no problems to display for this patient.   Past Surgical History:  Procedure Laterality Date  . HERNIA REPAIR      Prior to Admission medications   Medication Sig Start Date End Date Taking? Authorizing Provider  omeprazole (PRILOSEC) 20 MG capsule Take 1 capsule (20 mg total) by mouth 2 (two) times daily before a meal for 14 days. 02/16/20 03/01/20  Shaune Pollack, MD  ranitidine (ZANTAC) 300 MG capsule Take 1 capsule (300 mg total) by mouth every evening. 08/27/16   Cuthriell, Delorise Royals, PA-C  sucralfate (CARAFATE) 1 g tablet Take 1 tablet (1 g total) by mouth 4 (four) times daily -  with meals and at bedtime for 7 days. 02/16/20 02/23/20  Shaune Pollack, MD    Allergies Patient has no known allergies.  No family history on file.  Social History Social History   Tobacco Use  . Smoking status: Never Smoker  . Smokeless tobacco: Never Used  Substance Use Topics  . Alcohol use: No     Comment: occasional   . Drug use: No      Review of Systems Constitutional: No fever/chills Eyes: No visual changes. ENT: No sore throat. Cardiovascular: Denies chest pain. Respiratory: Denies shortness of breath.  Cough Gastrointestinal: Abdominal pain, nausea, diarrhea Genitourinary: Negative for dysuria. Musculoskeletal: Negative for back pain. Skin: Negative for rash. Neurological: Negative for headaches, focal weakness or numbness. All other ROS negative ____________________________________________   PHYSICAL EXAM:  VITAL SIGNS: ED Triage Vitals  Enc Vitals Group     BP 09/14/20 0249 (!) 147/104     Pulse Rate 09/14/20 0248 (!) 117     Resp 09/14/20 0248 16     Temp 09/14/20 0248 99.7 F (37.6 C)     Temp Source 09/14/20 0248 Oral     SpO2 09/14/20 0248 100 %     Weight 09/14/20 0249 190 lb (86.2 kg)     Height 09/14/20 0249 5\' 9"  (1.753 m)     Head Circumference --      Peak Flow --      Pain Score 09/14/20 0249 5     Pain Loc --      Pain Edu? --      Excl. in GC? --     Constitutional: Alert and oriented. Well appearing and in no acute distress. Eyes: Conjunctivae are normal. EOMI. Head: Atraumatic. Nose: No  congestion/rhinnorhea. Mouth/Throat: Mucous membranes are moist.   Neck: No stridor. Trachea Midline. FROM Cardiovascular: tachy, regular rhythm. Grossly normal heart sounds.  Good peripheral circulation. Respiratory: Normal respiratory effort.  No retractions. Lungs CTAB. Gastrointestinal: Tender in the lower abdomen no distention. No abdominal bruits.  Musculoskeletal: No lower extremity tenderness nor edema.  No joint effusions. Neurologic:  Normal speech and language. No gross focal neurologic deficits are appreciated.  Skin:  Skin is warm, dry and intact. No rash noted. Psychiatric: Mood and affect are normal. Speech and behavior are normal. GU: Deferred   ____________________________________________   LABS (all labs ordered are listed,  but only abnormal results are displayed)  Labs Reviewed  COMPREHENSIVE METABOLIC PANEL - Abnormal; Notable for the following components:      Result Value   Sodium 134 (*)    Glucose, Bld 109 (*)    Creatinine, Ser 1.34 (*)    AST 49 (*)    All other components within normal limits  CBC  URINALYSIS, COMPLETE (UACMP) WITH MICROSCOPIC   ____________________________________________ =RADIOLOGY Vela Prose, personally viewed and evaluated these images (plain radiographs) as part of my medical decision making, as well as reviewing the written report by the radiologist.  ED MD interpretation:  No pna  Official radiology report(s): DG Chest 2 View  Result Date: 09/14/2020 CLINICAL DATA:  Cough.  Diarrhea. EXAM: CHEST - 2 VIEW COMPARISON:  Radiograph 02/16/2020 FINDINGS: The cardiomediastinal contours are normal. The lungs are clear. Pulmonary vasculature is normal. No consolidation, pleural effusion, or pneumothorax. No acute osseous abnormalities are seen. Incidental note of air-filled colon in the upper abdomen with fluid level. IMPRESSION: 1.  No acute pulmonary process. 2. Air-fluid levels in the colon in the upper abdomen can be seen with diarrheal process. Electronically Signed   By: Narda Rutherford M.D.   On: 09/14/2020 03:09   CT ABDOMEN PELVIS W CONTRAST  Result Date: 09/14/2020 CLINICAL DATA:  Abdominal pain. EXAM: CT ABDOMEN AND PELVIS WITH CONTRAST TECHNIQUE: Multidetector CT imaging of the abdomen and pelvis was performed using the standard protocol following bolus administration of intravenous contrast. CONTRAST:  OMNIPAQUE IOHEXOL 300 MG/ML  SOLN COMPARISON:  Ultrasound abdomen 02/13/2018. FINDINGS: Lower chest: No acute abnormality. Hepatobiliary: The hepatic parenchyma is diffusely hypodense compared to the splenic parenchyma consistent with fatty infiltration. No focal liver abnormality. No gallstones, gallbladder wall thickening, or pericholecystic fluid. No biliary  dilatation. Pancreas: No focal lesion. Normal pancreatic contour. No surrounding inflammatory changes. No main pancreatic ductal dilatation. Spleen: Similar lobulated appearance of the splenic parenchyma. Normal in size without focal abnormality. Adrenals/Urinary Tract: No adrenal nodule bilaterally. Bilateral kidneys enhance symmetrically. No hydronephrosis. No hydroureter. The urinary bladder is unremarkable. On delayed imaging, there is no urothelial wall thickening and there are no filling defects in the opacified portions of the bilateral collecting systems or ureters. Stomach/Bowel: Stomach is within normal limits. No evidence of bowel wall thickening or dilatation. Appendix appears normal. Vascular/Lymphatic: No significant vascular findings are present. No enlarged abdominal or pelvic lymph nodes. Reproductive: Prostate is unremarkable. Other: No intraperitoneal free fluid. No intraperitoneal free gas. No organized fluid collection. Musculoskeletal: No abdominal wall hernia or abnormality. No suspicious lytic or blastic osseous lesions. No acute displaced fracture. IMPRESSION: 1. No acute intra-abdominal or intrapelvic abnormality. 2. Hepatic steatosis. Electronically Signed   By: Tish Frederickson M.D.   On: 09/14/2020 05:33    ____________________________________________   PROCEDURES  Procedure(s) performed (including Critical Care):  Procedures   ____________________________________________  INITIAL IMPRESSION / ASSESSMENT AND PLAN / ED COURSE  AEDYN KEMPFER was evaluated in Emergency Department on 09/14/2020 for the symptoms described in the history of present illness. He was evaluated in the context of the global COVID-19 pandemic, which necessitated consideration that the patient might be at risk for infection with the SARS-CoV-2 virus that causes COVID-19. Institutional protocols and algorithms that pertain to the evaluation of patients at risk for COVID-19 are in a state of rapid  change based on information released by regulatory bodies including the CDC and federal and state organizations. These policies and algorithms were followed during the patient's care in the ED.    Patient is a 50 year old who comes in with abdominal pain and lower abd pain and cough. Will get xray to see for PNA and covid swab. Will get CT to rule out diverticulitis, colitis, perforation, sbo etc. Denies recent antibiotics to suggest c diff.   Slightly elevated HR and kidney function. Pt given fluids.   Ct negative  Xray negative.   COVID +  Give some tessalon pearls/zpac. Discuss with pt f/u and quarantine.        ____________________________________________   FINAL CLINICAL IMPRESSION(S) / ED DIAGNOSES   Final diagnoses:  Viral illness  AKI (acute kidney injury) (HCC)  COVID-19      MEDICATIONS GIVEN DURING THIS VISIT:  Medications  sodium chloride 0.9 % bolus 1,000 mL (0 mLs Intravenous Stopped 09/14/20 0814)  iohexol (OMNIPAQUE) 300 MG/ML solution 100 mL (100 mLs Intravenous Contrast Given 09/14/20 0508)     ED Discharge Orders         Ordered    azithromycin (ZITHROMAX Z-PAK) 250 MG tablet        09/14/20 0656    benzonatate (TESSALON PERLES) 100 MG capsule  3 times daily PRN        09/14/20 0656           Note:  This document was prepared using Dragon voice recognition software and may include unintentional dictation errors.   Concha Se, MD 09/14/20 [phone]

## 2020-09-15 ENCOUNTER — Other Ambulatory Visit (HOSPITAL_BASED_OUTPATIENT_CLINIC_OR_DEPARTMENT_OTHER): Payer: Self-pay | Admitting: Nurse Practitioner

## 2020-09-15 ENCOUNTER — Encounter (HOSPITAL_BASED_OUTPATIENT_CLINIC_OR_DEPARTMENT_OTHER): Payer: Self-pay | Admitting: Nurse Practitioner

## 2020-09-15 NOTE — Progress Notes (Addendum)
Called to discuss with patient about COVID-19 symptoms and the use of one of the available treatments for those with mild to moderate Covid symptoms and at a high risk of hospitalization.  Pt appears to qualify for outpatient treatment due to co-morbid conditions and/or a member of an at-risk group in accordance with the FDA Emergency Use Authorization.    Symptom onset: 09/10/2020  Vaccinated: n Booster? n Immunocompromised? n Qualifiers: HTN  Unable to reach pt - No VM available   Tollie Eth

## 2021-05-17 ENCOUNTER — Other Ambulatory Visit: Payer: Self-pay

## 2021-05-17 ENCOUNTER — Emergency Department
Admission: EM | Admit: 2021-05-17 | Discharge: 2021-05-17 | Disposition: A | Discharge status: Home / Self Care | Payer: Medicare HMO | Attending: Emergency Medicine | Admitting: Emergency Medicine

## 2021-05-17 DIAGNOSIS — Z79899 Other long term (current) drug therapy: Secondary | ICD-10-CM | POA: Diagnosis not present

## 2021-05-17 DIAGNOSIS — M79675 Pain in left toe(s): Secondary | ICD-10-CM | POA: Diagnosis present

## 2021-05-17 DIAGNOSIS — I10 Essential (primary) hypertension: Secondary | ICD-10-CM | POA: Insufficient documentation

## 2021-05-17 DIAGNOSIS — M109 Gout, unspecified: Secondary | ICD-10-CM | POA: Insufficient documentation

## 2021-05-17 MED ORDER — PREDNISONE 50 MG PO TABS: 50.0000 mg | ORAL_TABLET | Freq: Every day | ORAL | 0 refills | Status: DC

## 2021-05-17 MED ORDER — NAPROXEN 500 MG PO TABS: 500.0000 mg | ORAL_TABLET | Freq: Two times a day (BID) | ORAL | 2 refills | Status: DC

## 2021-05-17 NOTE — ED Provider Notes (Signed)
[facility] Emergency Department Provider Note   ____________________________________________    I have reviewed the triage vital signs and the nursing notes.   HISTORY  Chief Complaint Foot Pain     HPI Albert George is a 50 y.o. male who presents with complaints of left great toe pain.  Patient reports 2 days of severe left great toe pain.  No injury.  No history of gout.  Has never had this before.  Has not taken anything for this  Past Medical History:  Diagnosis Date   GERD (gastroesophageal reflux disease)     Patient Active Problem List   Diagnosis Date Noted   Chronic midline low back pain without sciatica 07/10/2019   Essential hypertension 07/10/2019   Gastroesophageal reflux disease 07/10/2019   Fatty liver 04/11/2018    Past Surgical History:  Procedure Laterality Date   HERNIA REPAIR      Prior to Admission medications   Medication Sig Start Date End Date Taking? Authorizing Provider  naproxen (NAPROSYN) 500 MG tablet Take 1 tablet (500 mg total) by mouth 2 (two) times daily with a meal. 05/17/21  Yes Jene Every, MD  predniSONE (DELTASONE) 50 MG tablet Take 1 tablet (50 mg total) by mouth daily with breakfast. 05/17/21  Yes Jene Every, MD  losartan (COZAAR) 50 MG tablet Take by mouth. 07/10/19   [provider]  omeprazole (PRILOSEC) 20 MG capsule Take 1 capsule (20 mg total) by mouth 2 (two) times daily before a meal for 14 days. 02/16/20 03/01/20  Shaune Pollack, MD  ranitidine (ZANTAC) 300 MG capsule Take 1 capsule (300 mg total) by mouth every evening. 08/27/16   Cuthriell, Delorise Royals, PA-C  sucralfate (CARAFATE) 1 g tablet Take 1 tablet (1 g total) by mouth 4 (four) times daily -  with meals and at bedtime for 7 days. 02/16/20 02/23/20  Shaune Pollack, MD     Allergies Patient has no known allergies.  No family history on file.  Social History Social History   Tobacco Use   Smoking status: Never    Smokeless tobacco: Never  Substance Use Topics   Alcohol use: No    Comment: occasional    Drug use: No    Review of Systems  Constitutional: No fever/chills  ENT: No sore throat.   Gastrointestinal: No abdominal pain.  No nausea, no vomiting.   Genitourinary: Negative for dysuria. Musculoskeletal: As above Skin: Negative for rash. Neurological: Negative for headaches     ____________________________________________   PHYSICAL EXAM:  VITAL SIGNS: ED Triage Vitals  Enc Vitals Group     BP 05/17/21 1351 (!) 139/103     Pulse Rate 05/17/21 1351 77     Resp 05/17/21 1351 17     Temp 05/17/21 1351 98.5 F (36.9 C)     Temp Source 05/17/21 1351 Oral     SpO2 05/17/21 1351 97 %     Weight --      Height --      Head Circumference --      Peak Flow --      Pain Score 05/17/21 1343 4     Pain Loc --      Pain Edu? --      Excl. in GC? --      Constitutional: Alert and oriented.  Eyes: Conjunctivae are normal.  Head: Atraumatic. Nose: No congestion/rhinnorhea. Mouth/Throat: Mucous membranes are moist.   Cardiovascular: Normal rate, regular rhythm.  Respiratory: Normal respiratory effort.  No retractions.  Musculoskeletal: No lower extremity tenderness nor edema.   Neurologic:  Normal speech and language. No gross focal neurologic deficits are appreciated.   Skin:  Skin is warm, dry and intact.  Left great toe: No significant swelling, no erythema, no ulceration or injury.  Warm and well perfused   ____________________________________________   LABS (all labs ordered are listed, but only abnormal results are displayed)  Labs Reviewed - No data to display ____________________________________________  EKG   ____________________________________________  RADIOLOGY   ____________________________________________   PROCEDURES  Procedure(s) performed: No  Procedures   Critical Care performed:  No ____________________________________________   INITIAL IMPRESSION / ASSESSMENT AND PLAN / ED COURSE  Pertinent labs & imaging results that were available during my care of the patient were reviewed by me and considered in my medical decision making (see chart for details).   Patient well-appearing in no acute distress.  Exam is most consistent with gout, will treat with prednisone, NSAIDs, outpatient follow-up recommended.   ____________________________________________   FINAL CLINICAL IMPRESSION(S) / ED DIAGNOSES  Final diagnoses:  Acute gout involving toe of left foot, unspecified cause      NEW MEDICATIONS STARTED DURING THIS VISIT:  Discharge Medication List as of 05/17/2021  2:38 PM     START taking these medications   Details  naproxen (NAPROSYN) 500 MG tablet Take 1 tablet (500 mg total) by mouth 2 (two) times daily with a meal., Starting Mon 05/17/2021, Normal    predniSONE (DELTASONE) 50 MG tablet Take 1 tablet (50 mg total) by mouth daily with breakfast., Starting Mon 05/17/2021, Normal         Note:  This document was prepared using Dragon voice recognition software and may include unintentional dictation errors.    Jene Every, MD 05/17/21 2139

## 2021-05-17 NOTE — ED Triage Notes (Signed)
Pt comes with c/o left foot and toe pain.

## 2021-06-19 ENCOUNTER — Other Ambulatory Visit: Payer: Self-pay

## 2021-06-19 ENCOUNTER — Emergency Department
Admission: EM | Admit: 2021-06-19 | Discharge: 2021-06-19 | Disposition: A | Discharge status: Home / Self Care | Payer: Medicare HMO | Attending: Emergency Medicine | Admitting: Emergency Medicine

## 2021-06-19 DIAGNOSIS — I10 Essential (primary) hypertension: Secondary | ICD-10-CM | POA: Diagnosis not present

## 2021-06-19 DIAGNOSIS — Z20822 Contact with and (suspected) exposure to covid-19: Secondary | ICD-10-CM | POA: Diagnosis not present

## 2021-06-19 DIAGNOSIS — Z79899 Other long term (current) drug therapy: Secondary | ICD-10-CM | POA: Insufficient documentation

## 2021-06-19 DIAGNOSIS — R112 Nausea with vomiting, unspecified: Secondary | ICD-10-CM

## 2021-06-19 DIAGNOSIS — K529 Noninfective gastroenteritis and colitis, unspecified: Secondary | ICD-10-CM | POA: Diagnosis not present

## 2021-06-19 LAB — COMPREHENSIVE METABOLIC PANEL
ALT: 42 U/L (ref 0–44)
AST: 29 U/L (ref 15–41)
Albumin: 4.9 g/dL (ref 3.5–5.0)
Alkaline Phosphatase: 70 U/L (ref 38–126)
Anion gap: 10 (ref 5–15)
BUN: 23 mg/dL — ABNORMAL HIGH (ref 6–20)
CO2: 24 mmol/L (ref 22–32)
Calcium: 9.6 mg/dL (ref 8.9–10.3)
Chloride: 101 mmol/L (ref 98–111)
Creatinine, Ser: 1.08 mg/dL (ref 0.61–1.24)
GFR, Estimated: >60 mL/min (ref >60)
Glucose, Bld: 172 mg/dL — ABNORMAL HIGH (ref 70–99)
Potassium: 5 mmol/L (ref 3.5–5.1)
Sodium: 135 mmol/L (ref 135–145)
Total Bilirubin: 1.3 mg/dL — ABNORMAL HIGH (ref 0.3–1.2)
Total Protein: 8.2 g/dL — ABNORMAL HIGH (ref 6.5–8.1)

## 2021-06-19 LAB — LIPASE, BLOOD: Lipase: 28 U/L (ref 11–51)

## 2021-06-19 LAB — CBC
HCT: 46.5 % (ref 39.0–52.0)
Hemoglobin: 16.1 g/dL (ref 13.0–17.0)
MCH: 32 pg (ref 26.0–34.0)
MCHC: 34.6 g/dL (ref 30.0–36.0)
MCV: 92.4 fL (ref 80.0–100.0)
Platelets: 282 10*3/uL (ref 150–400)
RBC: 5.03 MIL/uL (ref 4.22–5.81)
RDW: 11.9 % (ref 11.5–15.5)
WBC: 12.4 10*3/uL — ABNORMAL HIGH (ref 4.0–10.5)
nRBC: 0 % (ref 0.0–0.2)

## 2021-06-19 LAB — RESP PANEL BY RT-PCR (FLU A&B, COVID) ARPGX2
Influenza A by PCR: NEGATIVE
Influenza B by PCR: NEGATIVE
SARS Coronavirus 2 by RT PCR: NEGATIVE

## 2021-06-19 MED ORDER — ONDANSETRON HCL 4 MG/2ML IJ SOLN
4.0000 mg | Freq: Once | INTRAMUSCULAR | Status: AC
  Administered 2021-06-19: 4 mg via INTRAVENOUS
  Filled 2021-06-19: qty 2

## 2021-06-19 MED ORDER — ONDANSETRON 4 MG PO TBDP: 4.0000 mg | ORAL_TABLET | Freq: Three times a day (TID) | ORAL | 0 refills | Status: DC | PRN

## 2021-06-19 MED ORDER — SODIUM CHLORIDE 0.9 % IV BOLUS
1000.0000 mL | Freq: Once | INTRAVENOUS | Status: AC
  Administered 2021-06-19: 1000 mL via INTRAVENOUS

## 2021-06-19 NOTE — ED Notes (Signed)
See triage note. Pt ambulatory to room. Pt c/o N/V/D since yesterday. Pt in NAD

## 2021-06-19 NOTE — ED Triage Notes (Signed)
Pt to ED for nausea that started yesterday. +dizziness.  Pt trying to vomit in triage, none noted in emesis bag.

## 2021-06-19 NOTE — ED Notes (Signed)
IV REMOVED. Dc ppw provided. Pt questions answered. Pt followup provided. Pt instructed to drink plently of fluids. Pt verbal consent for dc given. Assisted off unit vhia wheelchair

## 2021-06-19 NOTE — ED Provider Notes (Signed)
[facility] Emergency Department Provider Note   ____________________________________________   Event Date/Time   First MD Initiated Contact with Patient 06/19/21 1520     (approximate)  I have reviewed the triage vital signs and the nursing notes.   HISTORY  Chief Complaint Nausea    HPI Albert George is a 50 y.o. male with past medical history of hypertension and GERD who presents to the ED complaining of nausea and vomiting.  Patient reports that he has been dealing with persistent nausea, vomiting, and diarrhea for the past 24 to 48 hours.  He denies any associated abdominal pain and he has not noticed any blood in his emesis or stool.  He does feel weak and malaised with the symptoms, has been dizzy and lightheaded at times, also endorses a headache.  He has not noticed any fevers and denies any neck stiffness.  He has not had any cough, chest pain, or shortness of breath.  Other members of his family were sick with a "GI bug" last week.        Past Medical History:  Diagnosis Date   GERD (gastroesophageal reflux disease)     Patient Active Problem List   Diagnosis Date Noted   Chronic midline low back pain without sciatica 07/10/2019   Essential hypertension 07/10/2019   Gastroesophageal reflux disease 07/10/2019   Fatty liver 04/11/2018    Past Surgical History:  Procedure Laterality Date   HERNIA REPAIR      Prior to Admission medications   Medication Sig Start Date End Date Taking? Authorizing Provider  ondansetron (ZOFRAN ODT) 4 MG disintegrating tablet Take 1 tablet (4 mg total) by mouth every 8 (eight) hours as needed for nausea or vomiting. 06/19/21  Yes Chesley Noon, MD  losartan (COZAAR) 50 MG tablet Take by mouth. 07/10/19   [provider]  naproxen (NAPROSYN) 500 MG tablet Take 1 tablet (500 mg total) by mouth 2 (two) times daily with a meal. 05/17/21   Jene Every, MD  omeprazole (PRILOSEC) 20 MG capsule Take  1 capsule (20 mg total) by mouth 2 (two) times daily before a meal for 14 days. 02/16/20 03/01/20  Shaune Pollack, MD  predniSONE (DELTASONE) 50 MG tablet Take 1 tablet (50 mg total) by mouth daily with breakfast. 05/17/21   Jene Every, MD  ranitidine (ZANTAC) 300 MG capsule Take 1 capsule (300 mg total) by mouth every evening. 08/27/16   Cuthriell, Delorise Royals, PA-C  sucralfate (CARAFATE) 1 g tablet Take 1 tablet (1 g total) by mouth 4 (four) times daily -  with meals and at bedtime for 7 days. 02/16/20 02/23/20  Shaune Pollack, MD    Allergies Patient has no known allergies.  No family history on file.  Social History Social History   Tobacco Use   Smoking status: Never   Smokeless tobacco: Never  Substance Use Topics   Alcohol use: No    Comment: occasional    Drug use: No    Review of Systems  Constitutional: No fever/chills.  Positive for generalized weakness and malaise. Eyes: No visual changes. ENT: No sore throat. Cardiovascular: Denies chest pain.  Positive for lightheadedness and dizziness. Respiratory: Denies shortness of breath. Gastrointestinal: No abdominal pain.  Positive for nausea, vomiting, and diarrhea.  No constipation. Genitourinary: Negative for dysuria. Musculoskeletal: Negative for back pain. Skin: Negative for rash. Neurological: Positive for headache, negative for focal weakness or numbness.  ____________________________________________   PHYSICAL EXAM:  VITAL SIGNS: ED Triage Vitals [  06/19/21 1159]  Enc Vitals Group     BP (!) 153/104     Pulse Rate 80     Resp 20     Temp 98 F (36.7 C)     Temp Source Oral     SpO2 99 %     Weight 200 lb (90.7 kg)     Height 5\' 9"  (1.753 m)     Head Circumference      Peak Flow      Pain Score 0     Pain Loc      Pain Edu?      Excl. in GC?     Constitutional: Alert and oriented. Eyes: Conjunctivae are normal. Head: Atraumatic. Nose: No congestion/rhinnorhea. Mouth/Throat: Mucous membranes  are moist. Neck: Normal ROM Cardiovascular: Normal rate, regular rhythm. Grossly normal heart sounds.  2+ radial pulses bilaterally. Respiratory: Normal respiratory effort.  No retractions. Lungs CTAB. Gastrointestinal: Soft and nontender. No distention. Genitourinary: deferred Musculoskeletal: No lower extremity tenderness nor edema. Neurologic:  Normal speech and language. No gross focal neurologic deficits are appreciated. Skin:  Skin is warm, dry and intact. No rash noted. Psychiatric: Mood and affect are normal. Speech and behavior are normal.  ____________________________________________   LABS (all labs ordered are listed, but only abnormal results are displayed)  Labs Reviewed  COMPREHENSIVE METABOLIC PANEL - Abnormal; Notable for the following components:      Result Value   Glucose, Bld 172 (*)    BUN 23 (*)    Total Protein 8.2 (*)    Total Bilirubin 1.3 (*)    All other components within normal limits  CBC - Abnormal; Notable for the following components:   WBC 12.4 (*)    All other components within normal limits  RESP PANEL BY RT-PCR (FLU A&B, COVID) ARPGX2  LIPASE, BLOOD  URINALYSIS, ROUTINE W REFLEX MICROSCOPIC   ____________________________________________  EKG  ED ECG REPORT I, , the attending physician, personally viewed and interpreted this ECG.   Date: 06/19/2021  EKG Time: 12:06  Rate: 74  Rhythm: normal sinus rhythm  Axis: Normal  Intervals:none  ST&T Change: None    PROCEDURES  Procedure(s) performed (including Critical Care):  Procedures   ____________________________________________   INITIAL IMPRESSION / ASSESSMENT AND PLAN / ED COURSE      50 year old male with past medical history of hypertension and GERD presents to the ED complaining of persistent nausea, vomiting, and diarrhea over the past 24 to 48 hours associated with generalized weakness, lightheadedness, and dizziness.  Patient is afebrile with reassuring  vital signs, has no focal abdominal tenderness on exam.  Symptoms appear consistent with a viral gastroenteritis and dizziness is likely due to dehydration.  We will give dose of IV Zofran and hydrate with IV fluids.  Labs are reassuring at this time with no evidence of anemia or electrolyte abnormality, LFTs and lipase within normal limits.  Patient denies any urinary symptoms and I doubt UTI.  Patient feeling better following Zofran and IV fluids, is now tolerating water and crackers without difficulty.  He is appropriate for discharge home with prescription for Zofran.  He was counseled to drink plenty of fluids and to follow-up with his PCP, otherwise return to the ED for new worsening symptoms.  Patient agrees with plan.      ____________________________________________   FINAL CLINICAL IMPRESSION(S) / ED DIAGNOSES  Final diagnoses:  Gastroenteritis  Nausea and vomiting, unspecified vomiting type     ED Discharge Orders  Ordered    ondansetron (ZOFRAN ODT) 4 MG disintegrating tablet  Every 8 hours PRN        06/19/21 1707             Note:  This document was prepared using Dragon voice recognition software and may include unintentional dictation errors.    Chesley Noon, MD 06/19/21 [phone]

## 2021-06-19 NOTE — ED Notes (Signed)
First Nurse Note:  Patient coming [facility] for N/V/D, beginning yesterday. EMS vitals WNL.

## 2021-10-17 IMAGING — CR DG CHEST 2V
1 series · 2 of 2 positions shown · non-contrast
Comparison: Radiograph 02/16/2020

CLINICAL DATA: Cough.  Diarrhea.

EXAM:
CHEST - 2 VIEW

[Series 1: dg chest 2 view · 0.14mm/px · 2 of 2 slices shown]
[im 1/2]
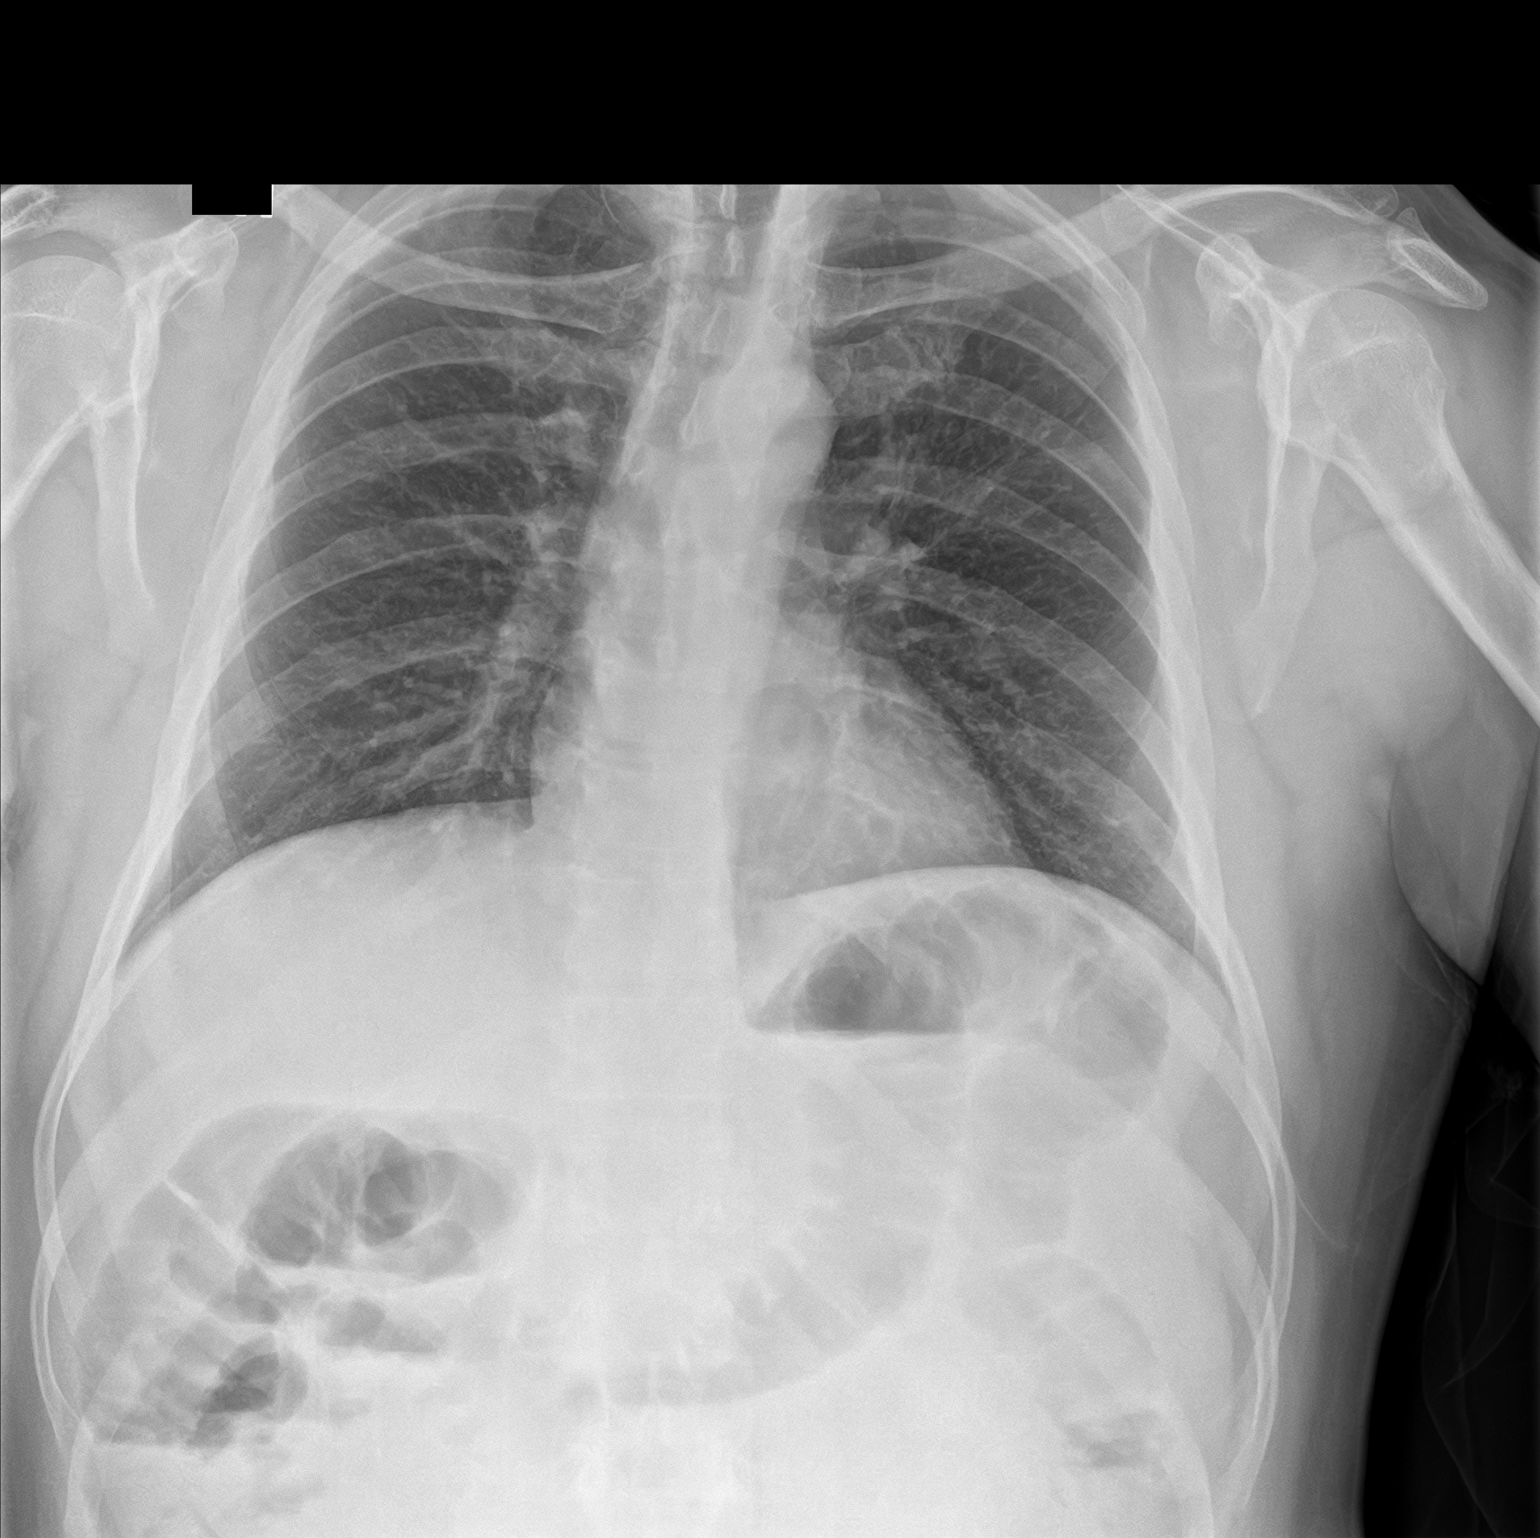
[im 2/2]
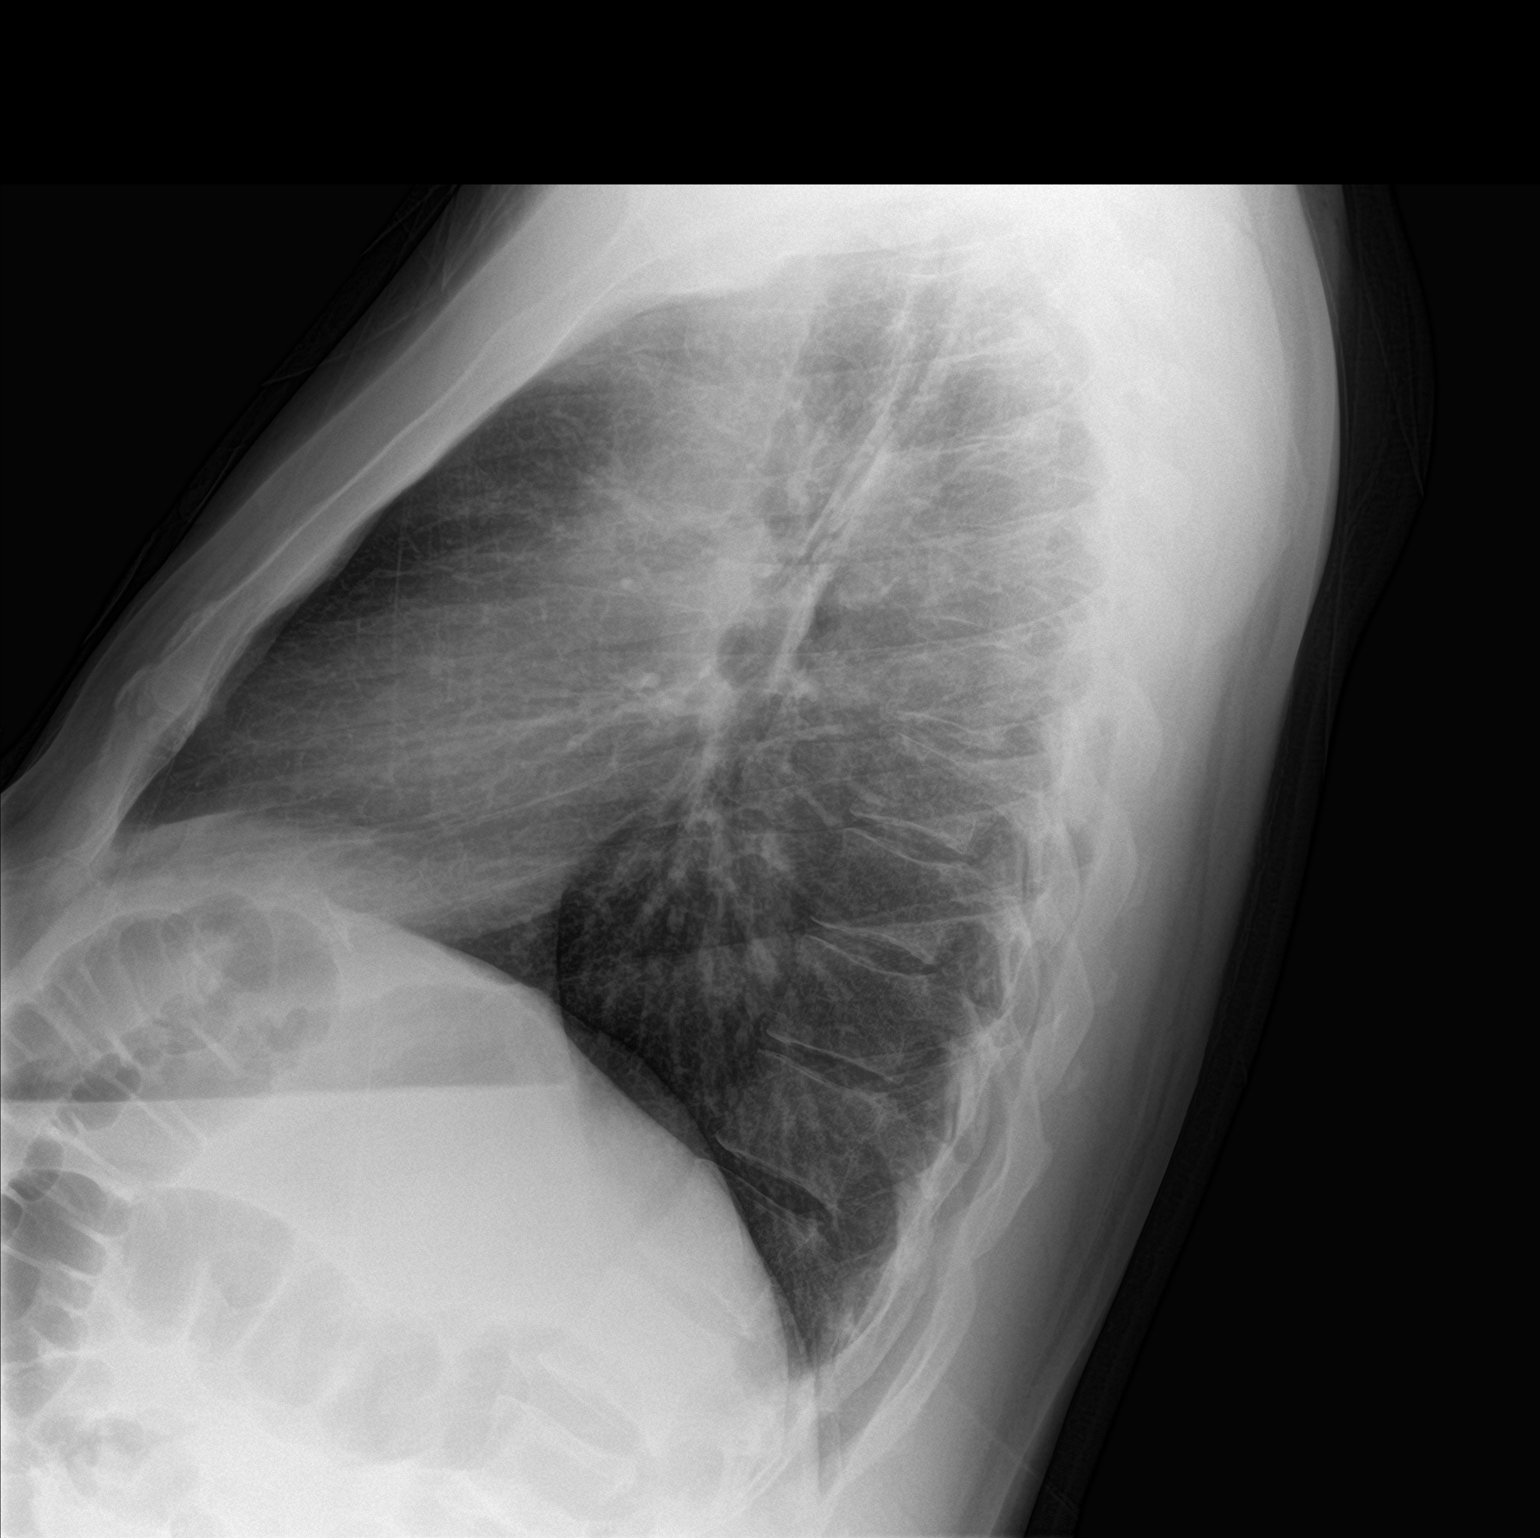

[2 of 2 positions shown; findings below may reference images not displayed]

FINDINGS: The cardiomediastinal contours are normal. The lungs are clear.
Pulmonary vasculature is normal. No consolidation, pleural effusion,
or pneumothorax. No acute osseous abnormalities are seen. Incidental
note of air-filled colon in the upper abdomen with fluid level.
IMPRESSION: 1.  No acute pulmonary process.
2. Air-fluid levels in the colon in the upper abdomen can be seen
with diarrheal process.

## 2023-07-27 ENCOUNTER — Emergency Department: Payer: Medicare HMO

## 2023-07-27 DIAGNOSIS — R059 Cough, unspecified: Secondary | ICD-10-CM | POA: Diagnosis present

## 2023-07-27 DIAGNOSIS — J09X2 Influenza due to identified novel influenza A virus with other respiratory manifestations: Secondary | ICD-10-CM | POA: Diagnosis not present

## 2023-07-27 DIAGNOSIS — R000 Tachycardia, unspecified: Secondary | ICD-10-CM | POA: Insufficient documentation

## 2023-07-27 DIAGNOSIS — R1084 Generalized abdominal pain: Secondary | ICD-10-CM | POA: Insufficient documentation

## 2023-07-27 DIAGNOSIS — I10 Essential (primary) hypertension: Secondary | ICD-10-CM | POA: Insufficient documentation

## 2023-07-27 DIAGNOSIS — Z20822 Contact with and (suspected) exposure to covid-19: Secondary | ICD-10-CM | POA: Diagnosis not present

## 2023-07-27 LAB — URINALYSIS, ROUTINE W REFLEX MICROSCOPIC
Bacteria, UA: NONE SEEN
Bilirubin Urine: NEGATIVE
Glucose, UA: 50 mg/dL — AB
Hgb urine dipstick: NEGATIVE
Ketones, ur: 5 mg/dL — AB
Leukocytes,Ua: NEGATIVE
Nitrite: NEGATIVE
Protein, ur: 30 mg/dL — AB
Specific Gravity, Urine: 1.02 (ref 1.005–1.030)
pH: 8 (ref 5.0–8.0)

## 2023-07-27 LAB — LIPASE, BLOOD: Lipase: 30 U/L (ref 11–51)

## 2023-07-27 LAB — COMPREHENSIVE METABOLIC PANEL
ALT: 143 U/L — ABNORMAL HIGH (ref 0–44)
AST: 88 U/L — ABNORMAL HIGH (ref 15–41)
Albumin: 4.3 g/dL (ref 3.5–5.0)
Alkaline Phosphatase: 57 U/L (ref 38–126)
Anion gap: 11 (ref 5–15)
BUN: 19 mg/dL (ref 6–20)
CO2: 28 mmol/L (ref 22–32)
Calcium: 8.8 mg/dL — ABNORMAL LOW (ref 8.9–10.3)
Chloride: 97 mmol/L — ABNORMAL LOW (ref 98–111)
Creatinine, Ser: 1.49 mg/dL — ABNORMAL HIGH (ref 0.61–1.24)
GFR, Estimated: 56 mL/min — ABNORMAL LOW (ref >60)
Glucose, Bld: 177 mg/dL — ABNORMAL HIGH (ref 70–99)
Potassium: 3.8 mmol/L (ref 3.5–5.1)
Sodium: 136 mmol/L (ref 135–145)
Total Bilirubin: 1 mg/dL (ref <1.2)
Total Protein: 7.4 g/dL (ref 6.5–8.1)

## 2023-07-27 LAB — CBC
HCT: 42.5 % (ref 39.0–52.0)
Hemoglobin: 14.7 g/dL (ref 13.0–17.0)
MCH: 32.3 pg (ref 26.0–34.0)
MCHC: 34.6 g/dL (ref 30.0–36.0)
MCV: 93.4 fL (ref 80.0–100.0)
Platelets: 155 10*3/uL (ref 150–400)
RBC: 4.55 MIL/uL (ref 4.22–5.81)
RDW: 11.9 % (ref 11.5–15.5)
WBC: 7.8 10*3/uL (ref 4.0–10.5)
nRBC: 0 % (ref 0.0–0.2)

## 2023-07-27 LAB — LACTIC ACID, PLASMA: Lactic Acid, Venous: 1.9 mmol/L (ref 0.5–1.9)

## 2023-07-27 NOTE — ED Triage Notes (Signed)
Pt ambulatory to triage for ABD pain which started today. Denies N/V. C/o of headaches and cough which has lingered last 2-3 months. Pt has fever in triage but denies any at home.

## 2023-07-27 NOTE — ED Provider Triage Note (Signed)
Emergency Medicine Provider Triage Evaluation Note  Albert George , a 52 y.o. male  was evaluated in triage.  Pt complains of abdominal pain and cough. Abdomen feels like it's swollen or like there are "balloons in there." Cough has been persistent for the past 2 months and is harsh and causing headache.  Physical Exam  There were no vitals taken for this visit. Gen:   Awake, no distress   Resp:  Normal effort  MSK:   Moves extremities without difficulty  Other:  Abdomen slightly distended and bloated.  Medical Decision Making  Medically screening exam initiated at 4:48 PM.  Appropriate orders placed.  GAMAL ENDERBY was informed that the remainder of the evaluation will be completed by another provider, this initial triage assessment does not replace that evaluation, and the importance of remaining in the ED until their evaluation is complete.  Febrile, tachycardic. Septic and abdominal pain workup started.   Chinita Pester, FNP 07/27/23 1706

## 2023-07-28 ENCOUNTER — Other Ambulatory Visit: Payer: Self-pay

## 2023-07-28 ENCOUNTER — Emergency Department: Payer: Medicare HMO

## 2023-07-28 ENCOUNTER — Encounter: Payer: Self-pay | Admitting: Emergency Medicine

## 2023-07-28 ENCOUNTER — Emergency Department
Admission: EM | Admit: 2023-07-28 | Discharge: 2023-07-28 | Disposition: A | Discharge status: Home / Self Care | Payer: Medicare HMO | Attending: Emergency Medicine | Admitting: Emergency Medicine

## 2023-07-28 DIAGNOSIS — J101 Influenza due to other identified influenza virus with other respiratory manifestations: Secondary | ICD-10-CM

## 2023-07-28 DIAGNOSIS — J09X2 Influenza due to identified novel influenza A virus with other respiratory manifestations: Secondary | ICD-10-CM | POA: Diagnosis not present

## 2023-07-28 LAB — RESP PANEL BY RT-PCR (RSV, FLU A&B, COVID)  RVPGX2
Influenza A by PCR: POSITIVE — AB
Influenza B by PCR: NEGATIVE
Resp Syncytial Virus by PCR: NEGATIVE
SARS Coronavirus 2 by RT PCR: NEGATIVE

## 2023-07-28 MED ORDER — FAMOTIDINE 20 MG PO TABS: 20.0000 mg | ORAL_TABLET | Freq: Two times a day (BID) | ORAL | 0 refills | Status: AC

## 2023-07-28 MED ORDER — ONDANSETRON 4 MG PO TBDP: 4.0000 mg | ORAL_TABLET | Freq: Three times a day (TID) | ORAL | 0 refills | Status: AC | PRN

## 2023-07-28 MED ORDER — SODIUM CHLORIDE 0.9 % IV BOLUS
1000.0000 mL | Freq: Once | INTRAVENOUS | Status: AC
  Administered 2023-07-28: 1000 mL via INTRAVENOUS

## 2023-07-28 MED ORDER — KETOROLAC TROMETHAMINE 15 MG/ML IJ SOLN
15.0000 mg | Freq: Once | INTRAMUSCULAR | Status: AC
  Administered 2023-07-28: 15 mg via INTRAVENOUS
  Filled 2023-07-28: qty 1

## 2023-07-28 MED ORDER — PANTOPRAZOLE SODIUM 40 MG IV SOLR
40.0000 mg | Freq: Once | INTRAVENOUS | Status: AC
Start: 2023-07-28 — End: 2023-07-28
  Administered 2023-07-28: 40 mg via INTRAVENOUS
  Filled 2023-07-28: qty 10

## 2023-07-28 MED ORDER — ALUM & MAG HYDROXIDE-SIMETH 200-200-20 MG/5ML PO SUSP
30.0000 mL | Freq: Once | ORAL | Status: AC
  Administered 2023-07-28: 30 mL via ORAL
  Filled 2023-07-28: qty 30

## 2023-07-28 MED ORDER — NAPROXEN 500 MG PO TABS: 500.0000 mg | ORAL_TABLET | Freq: Two times a day (BID) | ORAL | 0 refills | Status: AC

## 2023-07-28 MED ORDER — DICYCLOMINE HCL 20 MG PO TABS: 20.0000 mg | ORAL_TABLET | Freq: Four times a day (QID) | ORAL | 0 refills | Status: AC | PRN

## 2023-07-28 MED ORDER — ACETAMINOPHEN 325 MG PO TABS
650.0000 mg | ORAL_TABLET | Freq: Once | ORAL | Status: AC | PRN
  Administered 2023-07-28: 650 mg via ORAL
  Filled 2023-07-28: qty 2

## 2023-07-28 MED ORDER — IOHEXOL 350 MG/ML SOLN
75.0000 mL | Freq: Once | INTRAVENOUS | Status: AC | PRN
  Administered 2023-07-28: 75 mL via INTRAVENOUS

## 2023-07-28 NOTE — ED Notes (Signed)
Pt RA sats noted to be 88%. Pt denies SOB or CP. Pt placed on 2L [place]. EDP made aware.

## 2023-07-28 NOTE — ED Notes (Addendum)
Pt transported to CT ?

## 2023-07-28 NOTE — ED Provider Notes (Signed)
[facility] Provider Note    Event Date/Time   First MD Initiated Contact with Patient 07/28/23 0715     (approximate)   History   Chief Complaint: Cough and Abdominal Pain   HPI  Albert George is a 52 y.o. male with a history of GERD and hypertension who comes ED complaining of cough for the past 2 months, and over the past few days has been having headache, crampy abdominal pain, decreased appetite, fever.  No nausea vomiting or diarrhea.  No chest pain or shortness of breath.          Physical Exam   Triage Vital Signs: ED Triage Vitals  Encounter Vitals Group     BP 07/27/23 1648 (!) 143/91     Systolic BP Percentile --      Diastolic BP Percentile --      Pulse Rate 07/27/23 1648 (!) 130     Resp 07/27/23 1648 18     Temp 07/27/23 1648 (!) 102.8 F (39.3 C)     Temp Source 07/27/23 1648 Oral     SpO2 07/27/23 1648 97 %     Weight 07/28/23 0714 199 lb 15.3 oz (90.7 kg)     Height 07/28/23 0714 5\' 9"  (1.753 m)     Head Circumference --      Peak Flow --      Pain Score 07/27/23 1650 0     Pain Loc --      Pain Education --      Exclude from Growth Chart --     Most recent vital signs: Vitals:   07/28/23 0838 07/28/23 0839  BP:    Pulse: (!) 107 (!) 105  Resp:    Temp:    SpO2: (!) 88% 93%    General: Awake, no distress.  CV:  Good peripheral perfusion.  Tachycardia heart rate 130 Resp:  Normal effort.  Clear to auscultation bilaterally Abd:  No distention.  Soft, mild generalized tenderness Other:  Dry oral mucosa.  No lower extremity edema or asymmetric swelling   ED Results / Procedures / Treatments   Labs (all labs ordered are listed, but only abnormal results are displayed) Labs Reviewed  RESP PANEL BY RT-PCR (RSV, FLU A&B, COVID)  RVPGX2 - Abnormal; Notable for the following components:      Result Value   Influenza A by PCR POSITIVE (*)    All other components within normal limits  COMPREHENSIVE METABOLIC  PANEL - Abnormal; Notable for the following components:   Chloride 97 (*)    Glucose, Bld 177 (*)    Creatinine, Ser 1.49 (*)    Calcium 8.8 (*)    AST 88 (*)    ALT 143 (*)    GFR, Estimated 56 (*)    All other components within normal limits  URINALYSIS, ROUTINE W REFLEX MICROSCOPIC - Abnormal; Notable for the following components:   Color, Urine YELLOW (*)    APPearance CLEAR (*)    Glucose, UA 50 (*)    Ketones, ur 5 (*)    Protein, ur 30 (*)    All other components within normal limits  LIPASE, BLOOD  CBC  LACTIC ACID, PLASMA     EKG    RADIOLOGY CT chest abdomen pelvis interpreted by me, negative for pneumonia or tumor.  Radiology report reviewed   PROCEDURES:  Procedures   MEDICATIONS ORDERED IN ED: Medications  acetaminophen (TYLENOL) tablet 650 mg (650 mg Oral Given 07/28/23  4696)  sodium chloride 0.9 % bolus 1,000 mL (1,000 mLs Intravenous New Bag/Given 07/28/23 0825)  pantoprazole (PROTONIX) injection 40 mg (40 mg Intravenous Given 07/28/23 0826)  ketorolac (TORADOL) 15 MG/ML injection 15 mg (15 mg Intravenous Given 07/28/23 0826)  alum & mag hydroxide-simeth (MAALOX/MYLANTA) 200-200-20 MG/5ML suspension 30 mL (30 mLs Oral Given 07/28/23 0825)  iohexol (OMNIPAQUE) 350 MG/ML injection 75 mL (75 mLs Intravenous Contrast Given 07/28/23 0842)     IMPRESSION / MDM / ASSESSMENT AND PLAN / ED COURSE  I reviewed the triage vital signs and the nursing notes.  DDx: COVID, influenza, pneumonia, electrolyte derangement, dehydration, AKI, anemia, appendicitis, intra-abdominal abscess, diverticulitis  Patient's presentation is most consistent with acute presentation with potential threat to life or bodily function.  Patient presents with fever, chronic cough, generalized abdominal pain.  Has some mild abdominal tenderness as well.  He is tachycardic, has a high fever.  Initial labs and chest x-ray unremarkable.  Will proceed with CT imaging.  Will give IV fluids and  Toradol for supportive care   ----------------------------------------- 10:56 AM on 07/28/2023 ----------------------------------------- Labs and CT is reassuring.  Feeling better.  Viral swab positive for influenza explaining his symptoms.  Counseled patient on hydration and supportive care at home.  Stable for discharge.      FINAL CLINICAL IMPRESSION(S) / ED DIAGNOSES   Final diagnoses:  Influenza A     Rx / DC Orders   ED Discharge Orders          Ordered    dicyclomine (BENTYL) 20 MG tablet  Every 6 hours PRN        07/28/23 1054    ondansetron (ZOFRAN-ODT) 4 MG disintegrating tablet  Every 8 hours PRN        07/28/23 1054    famotidine (PEPCID) 20 MG tablet  2 times daily        07/28/23 1054    naproxen (NAPROSYN) 500 MG tablet  2 times daily with meals        07/28/23 1054             Note:  This document was prepared using Dragon voice recognition software and may include unintentional dictation errors.   Sharman Cheek, MD 07/28/23 1056

## 2023-07-28 NOTE — Discharge Instructions (Addendum)
Your influenza test is positive.  Your other lab tests and CT scans were all okay today.  Focus on drinking fluids and staying hydrated while this illness runs its course.

## 2023-07-28 NOTE — ED Notes (Signed)
See triage note  Presents with cough and some stomach discomfort post cough  Fever on arrival  No n/v
